# Patient Record
Sex: Female | Born: 1980 | Race: White | Hispanic: No | Marital: Married | Smoking: Current every day smoker
Health system: Southern US, Community
[De-identification: ages and names within clinical notes are randomized; demographics above are authoritative.]

## PROBLEM LIST (undated history)

## (undated) DIAGNOSIS — E079 Disorder of thyroid, unspecified: Secondary | ICD-10-CM

## (undated) DIAGNOSIS — I471 Supraventricular tachycardia, unspecified: Secondary | ICD-10-CM

## (undated) DIAGNOSIS — J45909 Unspecified asthma, uncomplicated: Secondary | ICD-10-CM

## (undated) DIAGNOSIS — I4891 Unspecified atrial fibrillation: Secondary | ICD-10-CM

## (undated) HISTORY — DX: Unspecified atrial fibrillation: I48.91

## (undated) HISTORY — PX: CHOLECYSTECTOMY: SHX55

---

## 2004-11-04 ENCOUNTER — Inpatient Hospital Stay (HOSPITAL_COMMUNITY): Admission: AD | Admit: 2004-11-04 | Discharge: 2004-11-04 | Payer: Self-pay | Admitting: Obstetrics and Gynecology

## 2004-11-10 ENCOUNTER — Inpatient Hospital Stay (HOSPITAL_COMMUNITY): Admission: AD | Admit: 2004-11-10 | Discharge: 2004-11-10 | Payer: Self-pay | Admitting: *Deleted

## 2004-11-11 ENCOUNTER — Inpatient Hospital Stay (HOSPITAL_COMMUNITY): Admission: AD | Admit: 2004-11-11 | Discharge: 2004-11-11 | Payer: Self-pay | Admitting: Obstetrics and Gynecology

## 2008-04-21 ENCOUNTER — Emergency Department (HOSPITAL_COMMUNITY): Admission: EM | Admit: 2008-04-21 | Discharge: 2008-04-22 | Payer: Self-pay | Admitting: Emergency Medicine

## 2008-04-24 ENCOUNTER — Ambulatory Visit (HOSPITAL_COMMUNITY): Admission: RE | Admit: 2008-04-24 | Discharge: 2008-04-24 | Payer: Self-pay | Admitting: General Surgery

## 2008-04-24 ENCOUNTER — Encounter (INDEPENDENT_AMBULATORY_CARE_PROVIDER_SITE_OTHER): Payer: Self-pay | Admitting: General Surgery

## 2008-07-25 ENCOUNTER — Emergency Department (HOSPITAL_COMMUNITY): Admission: EM | Admit: 2008-07-25 | Discharge: 2008-07-25 | Payer: Self-pay | Admitting: Emergency Medicine

## 2011-03-30 NOTE — Op Note (Signed)
NAMEALIE, MOUDY                 ACCOUNT NO.:  [id number]   MEDICAL RECORD NO.:  [id number]          PATIENT TYPE:  AMB   LOCATION:  DAY                          FACILITY:  [facility]   PHYSICIAN:  Anselm Pancoast. Weatherly, M.D.DATE OF BIRTH:  10-Sep-1981   DATE OF PROCEDURE:  04/24/2008  DATE OF DISCHARGE:                               OPERATIVE REPORT   PREOPERATIVE DIAGNOSIS:  Subacute cholecystitis with stones.   OPERATION:  Laparoscopic cholecystectomy with cholangiogram.   General anesthesia.   SURGEON:  Anselm Pancoast. Zachery Dakins, M.D.   ASSISTANT:  Nurse.   HISTORY:  Cecil Bixby is a 30 year old Caucasian female, mother of two,  who presented to the emergency room with pain on Sunday evening.  She  was evaluated, had had multiple previous episodes of indigestion,  usually postprandial, but had not been worked up.  In the emergency room  they did a CBC and CMET which were unremarkable.  They did an ultrasound  and it showed a distended gallbladder with stones, gave her antinausea  medicine and pain medication, and advised that she see [place] in the office  urgently.  I saw her the following day and she said the pain was  somewhat less than it had been but had never completely broken, and she  desired to go ahead and proceed with laparoscopic cholecystectomy as  soon as possible for pain.  It actually was a little less yesterday but  this morning she said she was still having discomfort.  We did not  repeat laboratory studies for preop but since SHE IS ALLERGIC TO  PENICILLIN WHICH CAUSES A RASH she was given 400 mg of Cipro.   She was taken to the operative suite.  Induction of general anesthesia,  endotracheal tube, oral tube into stomach.  The time-out with the  patient identification and planned procedure etc. was completed, and the  Cipro had been administered but this time.  She was prepped with  Betadine solution and draped in a sterile manner.  A small incision was  made below the  umbilicus.  The fascia was identified, picked up between  two Kochers, very carefully opened through the fascia, and underlying  peritoneum was picked up and opened this carefully and went into the  peritoneal cavity.  A pursestring suture of O Vicryl was placed and  Hasson cannula introduced.  The gallbladder had numerous adhesions  around it, it was distended but it was not acutely hemorrhagically  inflamed.  The upper 10-mm trocar was placed after anesthetizing the  fascia in the appropriate position, and the two lateral 5-mm trocars  were placed.  The gallbladder was grasped with a lateral retractor,  picked up, and then the adhesions were kind of carefully taken down from  it.  The proximal portion of the gallbladder peritoneum carefully opened  up, the anterior branch of the cystic artery was identified and doubly  clipped proximally, singly distally, and then divided.  The peritoneum  around the proximal portion of the gallbladder/cystic duct junction was  carefully freed up.  There was a little blood vessel with the  cystic  duct that with opening the cystic duct there was a little area of  bleeding, and this was lightly coagulated.  A Cook catheter was  introduced, held in place with a clip, and then an x-ray obtained.  The  extrahepatic biliary system was normal with good flow into the duodenum,  about a 1.5-cm cystic duct, and both the left-to-right radicals were  later identified.  I then removed the catheter, triply clipped the  cystic duct and then divided it, and then very carefully freed up the  gallbladder from its bed and placed the EndoCatch bag.  There was a  little bile spillage where the grasper was used to grab the gallbladder  and then I thoroughly irrigated it at the completion with almost 2  liters of saline until all return was clear.  Reinspection of the  clipped vessels, cystic duct, was done with good hemostasis, and the  gallbladder was sent for pathology  examination.  I put an additional  figure-of-eight suture in the fascia at the umbilicus and then I  anesthetized the fascia __________ .  The subcutaneous wounds were  closed with 4-0 Monocryl or Vicryl and the benzoin and Steri-Strips were  placed on the skin.  The patient tolerated the procedure nicely.  I  think she is planning on going home after the recovery room and I will  see her in the office in approximately 2 weeks.  She has Percocet left  over from her ER visit and no additional pain medications will be given.           ______________________________  Anselm Pancoast. Zachery Dakins, M.D.     WJW/MEDQ  D:  04/24/2008  T:  04/24/2008  Job:  [id number]

## 2011-04-02 NOTE — Consult Note (Signed)
[name], [name]                 ACCOUNT NO.:  [id number]   MEDICAL RECORD NO.:  [id number]          PATIENT TYPE:  MAT   LOCATION:  MATC                          FACILITY:  [facility]   PHYSICIAN:  Anselm Pancoast. Weatherly, M.D.DATE OF BIRTH:  07-Aug-1981   DATE OF CONSULTATION:  11/10/2004  DATE OF DISCHARGE:                                   CONSULTATION   SURGERY CONSULTATION:   CHIEF COMPLAINT:  Abdominal pain.   HISTORY:  Adrienne [name] is a 30 year old Caucasian female [redacted] weeks pregnant  who presented to the emergency room today with the following history.  Last  week she actually was having diarrhea and was taking Imodium for a few days,  she then started having cramping abdominal pain on Sunday, pain kind of on  the right abdomen going across the upper abdomen to the left, yesterday  maybe somewhat improved, is having bowel movements but not diarrhea anymore  and then today started having increasing pain and presented to the emergency  room I think about midday.  She was seen by Dr. Cherly Hensen who examined her and  did lab studies, she was afebrile, white count was 9800 and a pulse of about  100 and then a CT with oral and IV contrast was performed which showed an 8  mm appendix, no periappendiceal inflammation, a mesenteric node that was 1.4  cm in size kind of in the lower right abdomen and I was called on rule out  appendicitis.  On questioning [name] she said that really the cramping  sensation she describes is more of a cramping than actually bad pain did  start several days after started taking the Imodium and that she has had  problems with both loose and firmer bowel movements during this pregnancy.  This is her second pregnancy.   PHYSICAL EXAMINATION:  She has a gravida abdomen consistent with her  pregnancy size, she has got very active cramping bowel sounds, she is not  true muscle guarding and if you check the CT and where the appendix is  located it is above the iliac  crest and she is certainly cramping in that  location but not really acute abdomen in my opinion.   ASSESSMENT AND PLAN:  On review of the CT with the patient's husband and Dr.  Cherly Hensen I think that the cramping sensation that she is explaining is  probably more constipation that she is trying to push into the transverse  and left colon and I would recommend that we place her on a liquid diet and  then re-examine her tomorrow with a repeat white blood count.  If she has  increase in pain, becomes more tender in this location or whatever she may  require an open appendectomy but I think it is very unlikely to be acute  appendicitis at this time and I think that the Imodium that was taken  approximately a week ago has probably contributed to this constipation that  we see on CT at this time.  The patient I think can be safely released, she  is with her  husband and if she is having increasing pain or becoming tender  in that location then return to the emergency room here, if she is not  having increasing pain this evening on a liquid diet then I would definitely  think that she should return about 9 o'clock tomorrow morning for repeat  white count and Dr. Cherly Hensen or the ER physician can examine her and if there  is any question I will be happy  to re-examine her at that time.  I think it is unlikely this is going to be  appendicitis and hopefully since she is [redacted] weeks pregnant that the need of  surgery is not noted and that certainly fear of preterm delivery if surgery  was indicated or needed at this time and she certainly will not be able to  be done with a laparoscopic examination.      WJW/MEDQ  D:  11/10/2004  T:  11/10/2004  Job:  [id number]   cc:   Maxie Better, M.D.  [address]  [place]  [place] [address]  Fax: [phone]

## 2011-08-12 LAB — DIFFERENTIAL
Basophils Absolute: 0
Basophils Relative: 1
Eosinophils Absolute: 0.4
Eosinophils Relative: 5
Lymphocytes Relative: 36
Lymphs Abs: 2.6
Monocytes Absolute: 0.5
Monocytes Relative: 7
Neutro Abs: 3.9
Neutrophils Relative %: 52

## 2011-08-12 LAB — CBC
HCT: 35.2 — ABNORMAL LOW
Hemoglobin: 12.3
MCHC: 34.9
MCV: 88.7
Platelets: 248
RBC: 3.96
RDW: 12.8
WBC: 7.4

## 2011-08-12 LAB — COMPREHENSIVE METABOLIC PANEL
ALT: 24
AST: 20
Albumin: 3.6
Alkaline Phosphatase: 61
BUN: 14
CO2: 26
Calcium: 8.9
Chloride: 106
Creatinine, Ser: 0.56
GFR calc Af Amer: >60
GFR calc non Af Amer: >60
Glucose, Bld: 102 — ABNORMAL HIGH
Potassium: 3.9
Sodium: 137
Total Bilirubin: 0.7
Total Protein: 6.7

## 2011-08-12 LAB — URINALYSIS, ROUTINE W REFLEX MICROSCOPIC
Bilirubin Urine: NEGATIVE
Glucose, UA: NEGATIVE
Hgb urine dipstick: NEGATIVE
Ketones, ur: NEGATIVE
Nitrite: NEGATIVE
Protein, ur: NEGATIVE
Specific Gravity, Urine: 1.023
Urobilinogen, UA: 1
pH: 7

## 2011-08-12 LAB — URINE MICROSCOPIC-ADD ON

## 2011-08-12 LAB — LIPASE, BLOOD: Lipase: 21

## 2011-08-12 LAB — POCT PREGNANCY, URINE
Operator id: 277751
Preg Test, Ur: NEGATIVE

## 2011-08-18 LAB — POCT CARDIAC MARKERS
CKMB, poc: <1 — ABNORMAL LOW
Myoglobin, poc: 33.8
Troponin i, poc: <0.05

## 2011-08-18 LAB — POCT I-STAT, CHEM 8
BUN: 18
Calcium, Ion: 1.2
Chloride: 108
Creatinine, Ser: 0.9
Glucose, Bld: 101 — ABNORMAL HIGH
HCT: 38
Hemoglobin: 12.9
Potassium: 4.1
Sodium: 141
TCO2: 24

## 2012-06-29 DIAGNOSIS — F419 Anxiety disorder, unspecified: Secondary | ICD-10-CM | POA: Insufficient documentation

## 2017-06-17 DIAGNOSIS — R61 Generalized hyperhidrosis: Secondary | ICD-10-CM | POA: Insufficient documentation

## 2019-05-21 DIAGNOSIS — F17210 Nicotine dependence, cigarettes, uncomplicated: Secondary | ICD-10-CM | POA: Insufficient documentation

## 2020-03-13 ENCOUNTER — Other Ambulatory Visit: Payer: Self-pay

## 2020-03-13 ENCOUNTER — Encounter (HOSPITAL_COMMUNITY): Payer: Self-pay | Admitting: Emergency Medicine

## 2020-03-13 ENCOUNTER — Emergency Department (HOSPITAL_COMMUNITY)
Admission: EM | Admit: 2020-03-13 | Discharge: 2020-03-14 | Disposition: A | Discharge status: Home / Self Care | Payer: BC Managed Care – PPO | Attending: Emergency Medicine | Admitting: Emergency Medicine

## 2020-03-13 ENCOUNTER — Emergency Department (HOSPITAL_COMMUNITY): Payer: BC Managed Care – PPO

## 2020-03-13 DIAGNOSIS — R079 Chest pain, unspecified: Secondary | ICD-10-CM | POA: Insufficient documentation

## 2020-03-13 DIAGNOSIS — Z5321 Procedure and treatment not carried out due to patient leaving prior to being seen by health care provider: Secondary | ICD-10-CM | POA: Diagnosis not present

## 2020-03-13 HISTORY — DX: Supraventricular tachycardia: I47.1

## 2020-03-13 HISTORY — DX: Disorder of thyroid, unspecified: E07.9

## 2020-03-13 HISTORY — DX: Unspecified asthma, uncomplicated: J45.909

## 2020-03-13 HISTORY — DX: Supraventricular tachycardia, unspecified: I47.10

## 2020-03-13 LAB — CBC
HCT: 43.8 % (ref 36.0–46.0)
Hemoglobin: 14.4 g/dL (ref 12.0–15.0)
MCH: 30.2 pg (ref 26.0–34.0)
MCHC: 32.9 g/dL (ref 30.0–36.0)
MCV: 91.8 fL (ref 80.0–100.0)
Platelets: 179 10*3/uL (ref 150–400)
RBC: 4.77 MIL/uL (ref 3.87–5.11)
RDW: 12.1 % (ref 11.5–15.5)
WBC: 6.3 10*3/uL (ref 4.0–10.5)
nRBC: 0 % (ref 0.0–0.2)

## 2020-03-13 LAB — BASIC METABOLIC PANEL
Anion gap: 11 (ref 5–15)
BUN: 14 mg/dL (ref 6–20)
CO2: 22 mmol/L (ref 22–32)
Calcium: 9.3 mg/dL (ref 8.9–10.3)
Chloride: 106 mmol/L (ref 98–111)
Creatinine, Ser: 0.6 mg/dL (ref 0.44–1.00)
GFR calc Af Amer: >60 mL/min (ref >60)
GFR calc non Af Amer: >60 mL/min (ref >60)
Glucose, Bld: 98 mg/dL (ref 70–99)
Potassium: 4.2 mmol/L (ref 3.5–5.1)
Sodium: 139 mmol/L (ref 135–145)

## 2020-03-13 LAB — I-STAT BETA HCG BLOOD, ED (MC, WL, AP ONLY): I-stat hCG, quantitative: <5 m[IU]/mL (ref <5)

## 2020-03-13 LAB — TROPONIN I (HIGH SENSITIVITY): Troponin I (High Sensitivity): <2 ng/L (ref <18)

## 2020-03-13 LAB — PROTIME-INR
INR: 1 (ref 0.8–1.2)
Prothrombin Time: 12.4 seconds (ref 11.4–15.2)

## 2020-03-13 MED ORDER — SODIUM CHLORIDE 0.9% FLUSH: 3.0000 mL | Freq: Once | INTRAVENOUS | Status: DC

## 2020-03-13 NOTE — ED Triage Notes (Signed)
Patient arrived with EMS from home reports central chest pressure with palpitations and mild dizziness , no SOB , denies emesis or diaphoresis , pressure radiating to left arm , no cough or fever .

## 2020-03-13 NOTE — ED Notes (Signed)
pts name was called 3x for updated VS with no response.

## 2020-03-14 LAB — TROPONIN I (HIGH SENSITIVITY): Troponin I (High Sensitivity): <2 ng/L (ref <18)

## 2020-03-14 NOTE — ED Notes (Signed)
Pt did not respond when called for vitals check 

## 2020-05-14 DIAGNOSIS — E032 Hypothyroidism due to medicaments and other exogenous substances: Secondary | ICD-10-CM | POA: Insufficient documentation

## 2020-05-14 DIAGNOSIS — R002 Palpitations: Secondary | ICD-10-CM | POA: Insufficient documentation

## 2020-09-15 HISTORY — PX: ABDOMINOPLASTY: SUR9

## 2021-04-15 HISTORY — PX: LIPOSUCTION: SHX10

## 2021-06-12 ENCOUNTER — Emergency Department (HOSPITAL_COMMUNITY)
Admission: EM | Admit: 2021-06-12 | Discharge: 2021-06-12 | Disposition: A | Discharge status: Home / Self Care | Payer: BC Managed Care – PPO | Attending: Emergency Medicine | Admitting: Emergency Medicine

## 2021-06-12 ENCOUNTER — Emergency Department (HOSPITAL_COMMUNITY): Payer: BC Managed Care – PPO

## 2021-06-12 ENCOUNTER — Other Ambulatory Visit: Payer: Self-pay

## 2021-06-12 DIAGNOSIS — R519 Headache, unspecified: Secondary | ICD-10-CM | POA: Insufficient documentation

## 2021-06-12 DIAGNOSIS — R000 Tachycardia, unspecified: Secondary | ICD-10-CM | POA: Insufficient documentation

## 2021-06-12 DIAGNOSIS — R202 Paresthesia of skin: Secondary | ICD-10-CM | POA: Insufficient documentation

## 2021-06-12 DIAGNOSIS — J45909 Unspecified asthma, uncomplicated: Secondary | ICD-10-CM | POA: Diagnosis not present

## 2021-06-12 DIAGNOSIS — Y900 Blood alcohol level of less than 20 mg/100 ml: Secondary | ICD-10-CM | POA: Diagnosis not present

## 2021-06-12 DIAGNOSIS — H538 Other visual disturbances: Secondary | ICD-10-CM | POA: Insufficient documentation

## 2021-06-12 DIAGNOSIS — F17200 Nicotine dependence, unspecified, uncomplicated: Secondary | ICD-10-CM | POA: Insufficient documentation

## 2021-06-12 DIAGNOSIS — R42 Dizziness and giddiness: Secondary | ICD-10-CM | POA: Insufficient documentation

## 2021-06-12 DIAGNOSIS — Z79899 Other long term (current) drug therapy: Secondary | ICD-10-CM | POA: Insufficient documentation

## 2021-06-12 DIAGNOSIS — E039 Hypothyroidism, unspecified: Secondary | ICD-10-CM | POA: Insufficient documentation

## 2021-06-12 LAB — DIFFERENTIAL
Abs Immature Granulocytes: 0.03 10*3/uL (ref 0.00–0.07)
Basophils Absolute: 0 10*3/uL (ref 0.0–0.1)
Basophils Relative: 0 %
Eosinophils Absolute: 0.3 10*3/uL (ref 0.0–0.5)
Eosinophils Relative: 3 %
Immature Granulocytes: 0 %
Lymphocytes Relative: 23 %
Lymphs Abs: 2.3 10*3/uL (ref 0.7–4.0)
Monocytes Absolute: 0.7 10*3/uL (ref 0.1–1.0)
Monocytes Relative: 7 %
Neutro Abs: 6.6 10*3/uL (ref 1.7–7.7)
Neutrophils Relative %: 67 %

## 2021-06-12 LAB — URINALYSIS, ROUTINE W REFLEX MICROSCOPIC
Bilirubin Urine: NEGATIVE
Glucose, UA: NEGATIVE mg/dL
Hgb urine dipstick: NEGATIVE
Ketones, ur: 5 mg/dL — AB
Leukocytes,Ua: NEGATIVE
Nitrite: NEGATIVE
Protein, ur: NEGATIVE mg/dL
Specific Gravity, Urine: >1.046 — ABNORMAL HIGH (ref 1.005–1.030)
pH: 6 (ref 5.0–8.0)

## 2021-06-12 LAB — I-STAT CHEM 8, ED
BUN: 15 mg/dL (ref 6–20)
Calcium, Ion: 1.18 mmol/L (ref 1.15–1.40)
Chloride: 108 mmol/L (ref 98–111)
Creatinine, Ser: 0.5 mg/dL (ref 0.44–1.00)
Glucose, Bld: 113 mg/dL — ABNORMAL HIGH (ref 70–99)
HCT: 44 % (ref 36.0–46.0)
Hemoglobin: 15 g/dL (ref 12.0–15.0)
Potassium: 3.6 mmol/L (ref 3.5–5.1)
Sodium: 140 mmol/L (ref 135–145)
TCO2: 22 mmol/L (ref 22–32)

## 2021-06-12 LAB — I-STAT BETA HCG BLOOD, ED (MC, WL, AP ONLY): I-stat hCG, quantitative: <5 m[IU]/mL (ref <5)

## 2021-06-12 LAB — COMPREHENSIVE METABOLIC PANEL
ALT: 14 U/L (ref 0–44)
AST: 16 U/L (ref 15–41)
Albumin: 4 g/dL (ref 3.5–5.0)
Alkaline Phosphatase: 61 U/L (ref 38–126)
Anion gap: 7 (ref 5–15)
BUN: 13 mg/dL (ref 6–20)
CO2: 22 mmol/L (ref 22–32)
Calcium: 9.4 mg/dL (ref 8.9–10.3)
Chloride: 109 mmol/L (ref 98–111)
Creatinine, Ser: 0.65 mg/dL (ref 0.44–1.00)
GFR, Estimated: >60 mL/min (ref >60)
Glucose, Bld: 115 mg/dL — ABNORMAL HIGH (ref 70–99)
Potassium: 3.6 mmol/L (ref 3.5–5.1)
Sodium: 138 mmol/L (ref 135–145)
Total Bilirubin: 0.5 mg/dL (ref 0.3–1.2)
Total Protein: 7 g/dL (ref 6.5–8.1)

## 2021-06-12 LAB — RAPID URINE DRUG SCREEN, HOSP PERFORMED
Amphetamines: NOT DETECTED
Barbiturates: NOT DETECTED
Benzodiazepines: NOT DETECTED
Cocaine: NOT DETECTED
Opiates: NOT DETECTED
Tetrahydrocannabinol: NOT DETECTED

## 2021-06-12 LAB — CBC
HCT: 44.1 % (ref 36.0–46.0)
Hemoglobin: 14.5 g/dL (ref 12.0–15.0)
MCH: 31.5 pg (ref 26.0–34.0)
MCHC: 32.9 g/dL (ref 30.0–36.0)
MCV: 95.9 fL (ref 80.0–100.0)
Platelets: 225 10*3/uL (ref 150–400)
RBC: 4.6 MIL/uL (ref 3.87–5.11)
RDW: 12.9 % (ref 11.5–15.5)
WBC: 10 10*3/uL (ref 4.0–10.5)
nRBC: 0 % (ref 0.0–0.2)

## 2021-06-12 LAB — ETHANOL: Alcohol, Ethyl (B): <10 mg/dL (ref <10)

## 2021-06-12 LAB — APTT: aPTT: 29 seconds (ref 24–36)

## 2021-06-12 LAB — PROTIME-INR
INR: 0.9 (ref 0.8–1.2)
Prothrombin Time: 12.4 seconds (ref 11.4–15.2)

## 2021-06-12 MED ORDER — IOHEXOL 350 MG/ML SOLN
75.0000 mL | Freq: Once | INTRAVENOUS | Status: AC | PRN
  Administered 2021-06-12: 75 mL via INTRAVENOUS

## 2021-06-12 NOTE — ED Notes (Signed)
Noted L arm droop, decreased sensation to L arm. No other neuro deficits noted in triage

## 2021-06-12 NOTE — ED Triage Notes (Signed)
Pt c/o "spells" of disorientation, dizziness, tingling from behind L ear to mid-arm, feeling that is constant." Denies [place], endorses [facility], feels vision is constantly blurry, worse during episodes. States it began last night, felt "snapping" in her neck that woke her up. Denies medical hx or injury

## 2021-06-12 NOTE — ED Notes (Signed)
Pt ambulated to the bathroom to provide UA specimen

## 2021-06-12 NOTE — ED Provider Notes (Signed)
[name] Provider Note   CSN: [id number] Arrival date & time: 06/12/21  1503     History Chief Complaint  Patient presents with   Dizziness   Headache    Ad[name] is a 40 y.o. female.  Patient is a 40 year old female with a prior history of hypothyroidism and SVT who is presenting today with complaints of a sensation of snapping behind her left ear that started around midnight last night.  Patient was just lying in bed when the symptoms occurred.  It is seconds at a time and just feels like a rubber band popping.  She has not noticed any neck pain or significant headache.  She has felt nervous and slightly blurry vision today.  She denies any numbness or weakness in her arms or legs but does report dizziness when she moves her head too quickly or when she is trying to walk she feels a little bit off.  This snapping sensation has occurred to 4-5 times now since midnight.  It does not seem to be provoked by anything.  She denies any recent injury or activity that she could have done that would have caused the symptoms.  She has not noticed any change in her hearing.  No pain behind the ear or pain with palpation of her neck.  No similar symptoms in the past.  No pain in the neck or back.   Dizziness Associated symptoms: headaches   Headache Associated symptoms: dizziness       Past Medical History:  Diagnosis Date   Asthma    SVT (supraventricular tachycardia) (HCC)    Thyroid disease     There are no problems to display for this patient.   Past Surgical History:  Procedure Laterality Date   CESAREAN SECTION     CHOLECYSTECTOMY       OB History   No obstetric history on file.     No family history on file.  Social History   Tobacco Use   Smoking status: Every Day   Smokeless tobacco: Never  Substance Use Topics   Alcohol use: Never   Drug use: Never    Home Medications Prior to Admission medications   Not on  File    Allergies    Morphine and related and Penicillins  Review of Systems   Review of Systems  Neurological:  Positive for dizziness and headaches.  All other systems reviewed and are negative.  Physical Exam Updated Vital Signs BP 125/84 (BP Location: Left Arm)   Pulse 99   Temp 98 F (36.7 C) (Oral)   Resp (!) 25   SpO2 98%   Physical Exam Vitals and nursing note reviewed.  Constitutional:      General: She is not in acute distress.    Appearance: She is well-developed.  HENT:     Head: Normocephalic and atraumatic.     Right Ear: Tympanic membrane and external ear normal.     Left Ear: Tympanic membrane and external ear normal.     Ears:     Comments: No mastoid tenderness bilaterally    Nose: Nose normal.  Eyes:     General: No visual field deficit.    Pupils: Pupils are equal, round, and reactive to light.  Cardiovascular:     Rate and Rhythm: Normal rate and regular rhythm.     Heart sounds: Normal heart sounds. No murmur heard.   No friction rub.  Pulmonary:     Effort:  Pulmonary effort is normal.     Breath sounds: Normal breath sounds. No wheezing or rales.  Abdominal:     General: Bowel sounds are normal. There is no distension.     Palpations: Abdomen is soft.     Tenderness: There is no abdominal tenderness. There is no guarding or rebound.  Musculoskeletal:        General: No tenderness. Normal range of motion.     Cervical back: Normal range of motion and neck supple. No tenderness. No spinous process tenderness or muscular tenderness.     Comments: No edema  Lymphadenopathy:     Cervical: No cervical adenopathy.  Skin:    General: Skin is warm and dry.     Findings: No rash.  Neurological:     Mental Status: She is alert and oriented to person, place, and time.     Cranial Nerves: No cranial nerve deficit or facial asymmetry.     Sensory: Sensation is intact.     Motor: Motor function is intact. No weakness or pronator drift.      Coordination: Coordination is intact. Coordination normal. Finger-Nose-Finger Test and Heel to [facility] Test normal.     Gait: Gait is intact. Gait normal.     Comments: HINTS exam is neg.    Psychiatric:        Speech: Speech normal.        Behavior: Behavior normal.    ED Results / Procedures / Treatments   Labs (all labs ordered are listed, but only abnormal results are displayed) Labs Reviewed  COMPREHENSIVE METABOLIC PANEL - Abnormal; Notable for the following components:      Result Value   Glucose, Bld 115 (*)    All other components within normal limits  I-STAT CHEM 8, ED - Abnormal; Notable for the following components:   Glucose, Bld 113 (*)    All other components within normal limits  ETHANOL  PROTIME-INR  APTT  CBC  DIFFERENTIAL  RAPID URINE DRUG SCREEN, HOSP PERFORMED  URINALYSIS, ROUTINE W REFLEX MICROSCOPIC  I-STAT BETA HCG BLOOD, ED ([facility], [facility], [facility] ONLY)    EKG EKG Interpretation  Date/Time:  Friday June 12 2021 15:28:58 EDT Ventricular Rate:  123 PR Interval:  134 QRS Duration: 106 QT Interval:  320 QTC Calculation: 458 R Axis:   95 Text Interpretation: Sinus tachycardia Right atrial enlargement Rightward axis Pulmonary disease pattern Nonspecific ST abnormality Confirmed by Gwyneth Sprout ([id number]) on 06/12/2021 5:58:04 PM  Radiology CT Angio Head W or Wo Contrast  Result Date: 06/12/2021 CLINICAL DATA:  Carotid artery dissection suspected. Additional history provided: Spells of disorientation, tingling from behind left ear to mid arm, dizziness, EXAM: CT ANGIOGRAPHY HEAD AND NECK TECHNIQUE: Multidetector CT imaging of the head and neck was performed using the standard protocol during bolus administration of intravenous contrast. Multiplanar CT image reconstructions and MIPs were obtained to evaluate the vascular anatomy. Carotid stenosis measurements (when applicable) are obtained utilizing NASCET criteria, using the distal internal carotid diameter as the  denominator. CONTRAST:  37mL OMNIPAQUE IOHEXOL 350 MG/ML SOLN COMPARISON:  None. FINDINGS: CT HEAD FINDINGS Brain: Cerebral volume is normal. There is no acute intracranial hemorrhage. No demarcated cortical infarct. No extra-axial fluid collection. No evidence of an intracranial mass. No midline shift. Vascular: No hyperdense vessel. Skull: Normal. Negative for fracture or focal lesion. Sinuses: Moderate mucosal thickening within the right sphenoid sinus. Mild mucosal thickening within the left sphenoid sinus. Complete opacification of the right sphenoid sinus. Mild mucosal  thickening within the left sphenoid sinus. Mucosal thickening and fluid levels within the bilateral maxillary sinuses. Orbits: No mass or acute finding. Review of the MIP images confirms the above findings CTA NECK FINDINGS Aortic arch: Standard aortic branching. The visualized aortic arch is normal in caliber. Streak and beam hardening artifact arising from a dense right-sided contrast bolus partially obscures the proximal right subclavian artery. Within this limitation, there is no hemodynamically significant stenosis of the innominate or proximal subclavian arteries. Right carotid system: CCA and ICA patent within the neck without stenosis or significant atherosclerotic disease. No evidence of dissection. Left carotid system: CCA and ICA patent within the neck without stenosis or significant atherosclerotic disease. No evidence of dissection. Vertebral arteries: Codominant and patent within the neck without stenosis. No evidence of dissection. Skeleton: No neck mass or cervical lymphadenopathy. Other neck: Cervical spondylosis. Upper chest: No consolidation within the imaged lung apices. Review of the MIP images confirms the above findings CTA HEAD FINDINGS Anterior circulation: The intracranial internal carotid arteries are patent. The M1 middle cerebral arteries are patent. No M2 proximal branch occlusion is identified. Atherosclerotic  irregularity of the M2 and more distal middle cerebral artery vessels bilaterally. Most notably, there is a severe focal stenosis within a mid M2 right MCA vessel (series 14, image 12). The anterior cerebral arteries are patent. No intracranial aneurysm is identified. Posterior circulation: The posterior cerebral arteries are patent. Mild atherosclerotic irregularity of the V4 vertebral arteries bilaterally without stenosis The basilar artery is patent. The posterior cerebral arteries are patent. Posterior communicating arteries are hypoplastic or absent bilaterally. Venous sinuses: Within the limitations of contrast timing, no convincing thrombus. Anatomic variants: As described Review of the MIP images confirms the above findings IMPRESSION: CT head: 1. No evidence of acute intracranial abnormality. 2. Paranasal sinus disease, as described. Correlate for acute sinusitis. CTA neck: 1. The common carotid, internal carotid and vertebral arteries are patent within the neck without stenosis. No evidence of dissection. 2. Cervical spondylosis. CTA head: 1. No intracranial large vessel occlusion is identified. 2. Intracranial atherosclerotic disease, as described. Most notably, there is a severe focal stenosis within a mid M2 right MCA vessel. Electronically Signed   By: Jackey Loge DO   On: 06/12/2021 18:18   CT Angio Neck W and/or Wo Contrast  Result Date: 06/12/2021 CLINICAL DATA:  Carotid artery dissection suspected. Additional history provided: Spells of disorientation, tingling from behind left ear to mid arm, dizziness, EXAM: CT ANGIOGRAPHY HEAD AND NECK TECHNIQUE: Multidetector CT imaging of the head and neck was performed using the standard protocol during bolus administration of intravenous contrast. Multiplanar CT image reconstructions and MIPs were obtained to evaluate the vascular anatomy. Carotid stenosis measurements (when applicable) are obtained utilizing NASCET criteria, using the distal internal  carotid diameter as the denominator. CONTRAST:  56mL OMNIPAQUE IOHEXOL 350 MG/ML SOLN COMPARISON:  None. FINDINGS: CT HEAD FINDINGS Brain: Cerebral volume is normal. There is no acute intracranial hemorrhage. No demarcated cortical infarct. No extra-axial fluid collection. No evidence of an intracranial mass. No midline shift. Vascular: No hyperdense vessel. Skull: Normal. Negative for fracture or focal lesion. Sinuses: Moderate mucosal thickening within the right sphenoid sinus. Mild mucosal thickening within the left sphenoid sinus. Complete opacification of the right sphenoid sinus. Mild mucosal thickening within the left sphenoid sinus. Mucosal thickening and fluid levels within the bilateral maxillary sinuses. Orbits: No mass or acute finding. Review of the MIP images confirms the above findings CTA NECK FINDINGS Aortic arch: Standard  aortic branching. The visualized aortic arch is normal in caliber. Streak and beam hardening artifact arising from a dense right-sided contrast bolus partially obscures the proximal right subclavian artery. Within this limitation, there is no hemodynamically significant stenosis of the innominate or proximal subclavian arteries. Right carotid system: CCA and ICA patent within the neck without stenosis or significant atherosclerotic disease. No evidence of dissection. Left carotid system: CCA and ICA patent within the neck without stenosis or significant atherosclerotic disease. No evidence of dissection. Vertebral arteries: Codominant and patent within the neck without stenosis. No evidence of dissection. Skeleton: No neck mass or cervical lymphadenopathy. Other neck: Cervical spondylosis. Upper chest: No consolidation within the imaged lung apices. Review of the MIP images confirms the above findings CTA HEAD FINDINGS Anterior circulation: The intracranial internal carotid arteries are patent. The M1 middle cerebral arteries are patent. No M2 proximal branch occlusion is  identified. Atherosclerotic irregularity of the M2 and more distal middle cerebral artery vessels bilaterally. Most notably, there is a severe focal stenosis within a mid M2 right MCA vessel (series 14, image 12). The anterior cerebral arteries are patent. No intracranial aneurysm is identified. Posterior circulation: The posterior cerebral arteries are patent. Mild atherosclerotic irregularity of the V4 vertebral arteries bilaterally without stenosis The basilar artery is patent. The posterior cerebral arteries are patent. Posterior communicating arteries are hypoplastic or absent bilaterally. Venous sinuses: Within the limitations of contrast timing, no convincing thrombus. Anatomic variants: As described Review of the MIP images confirms the above findings IMPRESSION: CT head: 1. No evidence of acute intracranial abnormality. 2. Paranasal sinus disease, as described. Correlate for acute sinusitis. CTA neck: 1. The common carotid, internal carotid and vertebral arteries are patent within the neck without stenosis. No evidence of dissection. 2. Cervical spondylosis. CTA head: 1. No intracranial large vessel occlusion is identified. 2. Intracranial atherosclerotic disease, as described. Most notably, there is a severe focal stenosis within a mid M2 right MCA vessel. Electronically Signed   By: Jackey LogeKyle  Golden DO   On: 06/12/2021 18:18    Procedures Procedures   Medications Ordered in ED Medications  iohexol (OMNIPAQUE) 350 MG/ML injection 75 mL (75 mLs Intravenous Contrast Given 06/12/21 1721)    ED Course  I have reviewed the triage vital signs and the nursing notes.  Pertinent labs & imaging results that were available during my care of the patient were reviewed by me and considered in my medical decision making (see chart for details).    MDM Rules/Calculators/A&P                           40 year old healthy female presenting today with unusual sensation of a snapping pain behind her left ear,  feeling dizzy that has been occurring intermittently since midnight.  In triage they noted the patient had a left arm drift and decreased sensation in the left arm however on my exam patient has no evidence of pronator drift and sensation and strength are intact.  Patient has no acute findings of her ears and normal visual fields.  She has no nystagmus noted at this time.  She denies any recent injuries, chiropractics or hyperextension of the neck.  She does work a Health and safety inspectordesk job but denies any neck pain or headache.  Symptoms are not classic for stroke.  Concern for possible dissection versus aneurysm.  Labs are all normal and reassuring.  On exam patient currently just reports she feels slightly dizzy but has no  other complaints.  HINTS exam is normal.  7:36 PM CT overall noncannulated approximately a small area of severe stenosis in M2.  Spoke with Dr. Otelia Limes with neurology about management of this.  He reports this could be artifact but a baby aspirin would be all that is needed at this time.  Symptoms patient is having today would not be related to this.  Given her negative scan otherwise, normal exam and normal hints exam do not feel that patient needs an MRI at this time and is most likely peripheral in nature.  Will give follow-up with ENT.  Findings discussed with the patient and her husband.  They are comfortable with this plan.  She already has meclizine at home.   Final Clinical Impression(s) / ED Diagnoses Final diagnoses:  Vertigo    Rx / DC Orders ED Discharge Orders     None        Gwyneth Sprout, MD 06/12/21 1939

## 2021-06-12 NOTE — Discharge Instructions (Addendum)
Your CAT scans today look normal except for possible narrowing in 1 small area which may even be artifact but to be safe you can take a baby aspirin per day.  No signs of aneurysms, stroke or bleeding today.  No signs of brain tumors.  You can take meclizine as needed for the dizziness.  However if it becomes worsening or unable to walk or start vomiting, high fever or severe neck pain you should return to the emergency room.

## 2021-06-12 NOTE — ED Provider Notes (Signed)
Emergency Medicine Provider Triage Evaluation Note  Adrienne [name] , a 40 y.o. female  was evaluated in triage.  Pt complains of let neck pain, headache, left arm tingling and bilat blurred vision that started at 12am. Denies weakness.  Review of Systems  Positive: Neck pain, headache, left arm tingling, blurred vision Negative: weakness  Physical Exam  BP (!) 136/93 (BP Location: Left Arm)   Pulse (!) 115   Temp 98.8 F (37.1 C)   Resp 18   SpO2 96%  Gen:   Awake, no distress   Resp:  Normal effort  MSK:   Moves extremities without difficulty  Other:  Left arm drift, decreased sensation to the left arm, no facial droop, no visual field cut  Medical Decision Making  Medically screening exam initiated at 3:34 PM.  Appropriate orders placed.  Adrienne [name] was informed that the remainder of the evaluation will be completed by another provider, this initial triage assessment does not replace that evaluation, and the importance of remaining in the ED until their evaluation is complete.     Karrie Meres, PA-C 06/12/21 1548    Cathren Laine, MD 06/13/21 1256

## 2021-11-10 ENCOUNTER — Other Ambulatory Visit: Payer: Self-pay | Admitting: Neurosurgery

## 2021-11-10 DIAGNOSIS — M5416 Radiculopathy, lumbar region: Secondary | ICD-10-CM

## 2021-12-08 ENCOUNTER — Inpatient Hospital Stay: Admission: RE | Admit: 2021-12-08 | Payer: BC Managed Care – PPO | Source: Ambulatory Visit

## 2022-06-08 ENCOUNTER — Other Ambulatory Visit (HOSPITAL_COMMUNITY)
Admission: RE | Admit: 2022-06-08 | Discharge: 2022-06-08 | Disposition: A | Discharge status: Home / Self Care | Payer: BC Managed Care – PPO | Source: Ambulatory Visit | Attending: Advanced Practice Midwife | Admitting: Advanced Practice Midwife

## 2022-06-08 ENCOUNTER — Encounter (HOSPITAL_BASED_OUTPATIENT_CLINIC_OR_DEPARTMENT_OTHER): Payer: Self-pay | Admitting: Advanced Practice Midwife

## 2022-06-08 ENCOUNTER — Telehealth (HOSPITAL_BASED_OUTPATIENT_CLINIC_OR_DEPARTMENT_OTHER): Payer: Self-pay | Admitting: Advanced Practice Midwife

## 2022-06-08 ENCOUNTER — Ambulatory Visit (INDEPENDENT_AMBULATORY_CARE_PROVIDER_SITE_OTHER): Payer: BC Managed Care – PPO | Admitting: Advanced Practice Midwife

## 2022-06-08 VITALS — BP 128/89 | HR 105 | Ht 63.75 in | Wt 203.2 lb

## 2022-06-08 DIAGNOSIS — Z124 Encounter for screening for malignant neoplasm of cervix: Secondary | ICD-10-CM

## 2022-06-08 DIAGNOSIS — J302 Other seasonal allergic rhinitis: Secondary | ICD-10-CM | POA: Insufficient documentation

## 2022-06-08 DIAGNOSIS — Z23 Encounter for immunization: Secondary | ICD-10-CM

## 2022-06-08 DIAGNOSIS — Z1231 Encounter for screening mammogram for malignant neoplasm of breast: Secondary | ICD-10-CM | POA: Diagnosis not present

## 2022-06-08 DIAGNOSIS — N939 Abnormal uterine and vaginal bleeding, unspecified: Secondary | ICD-10-CM | POA: Diagnosis not present

## 2022-06-08 MED ORDER — SLYND 4 MG PO TABS: 1.0000 | ORAL_TABLET | Freq: Every day | ORAL | 11 refills | Status: DC

## 2022-06-08 NOTE — Telephone Encounter (Addendum)
Rx sent today for [facility] POP for AUB.  This may not be on formulary for pt insurance. Given irregular bleeding with other POPs, this medication will more likely create a withdrawal bleed monthly, and reduce her breakthrough bleeding.  I called patient after her visit to see if costs/insurance were prohibitive at the pharmacy.  Our office can do prior authorization, or pt can use discount app at pharmacy to lower cost. Pt has 2 risk factors for OCPs with smoking and age over 29 so progesterone only methods preferred.

## 2022-06-08 NOTE — Progress Notes (Signed)
Subjective:     Adrienne [name] is a 41 y.o. female here at [facility] for a routine exam.  Current complaints: bleeding has become more irregular, heavy and more painful with recent menses.  Personal health questionnaire reviewed: yes.  Do you have a primary care provider? yes Do you feel safe at home? yes  Flowsheet Row Office Visit from 06/08/2022 in [facility]  PHQ-2 Total Score 0       Health Maintenance Due  Topic Date Due   HIV Screening  Never done   Hepatitis C Screening  Never done   PAP SMEAR-Modifier  Never done   COVID-19 Vaccine (4 - Booster for Pfizer series) 12/06/2020   HPV VACCINES (2 - 3-dose SCDM series) 07/06/2022     Risk factors for chronic health problems: Smoking: Cigarettes daily Alchohol/how much: occasional Pt BMI: Body mass index is 35.15 kg/m.   Gynecologic History Patient's last menstrual period was 05/30/2022 (exact date). Contraception: oral progesterone-only contraceptive Last Pap: 2018. Results were: normal Last mammogram: 08/19/21.  Results were: normal.  Obstetric History OB History  Gravida Para Term Preterm AB Living  4 2     2 2   SAB IAB Ectopic Multiple Live Births    2          # Outcome Date GA Lbr Len/2nd Weight Sex Delivery Anes PTL Lv  4 Para           3 Para           2 IAB           1 IAB              The following portions of the patient's history were reviewed and updated as appropriate: allergies, current medications, past family history, past medical history, past social history, past surgical history, and problem list.  Review of Systems Pertinent items noted in HPI and remainder of comprehensive ROS otherwise negative.    Objective:   BP 128/89   Pulse (!) 105   Ht 5' 3.75" (1.619 m)   Wt 203 lb 3.2 oz (92.2 kg)   LMP 05/30/2022 (Exact Date)   BMI 35.15 kg/m  VS reviewed, nursing note reviewed,  Constitutional: well developed, well nourished, no distress HEENT:  normocephalic CV: normal rate Pulm/chest wall: normal effort Breast Exam:   exam performed: right breast normal without mass, skin or nipple changes or axillary nodes, left breast normal without mass, skin or nipple changes or axillary nodes Abdomen: soft Neuro: alert and oriented x 3 Skin: warm, dry Psych: affect normal Pelvic exam: Performed: Cervix pink, visually closed, without lesion, scant white creamy discharge, vaginal walls and external genitalia normal Bimanual exam: Cervix 0/long/high, firm, anterior, neg CMT, uterus nontender, nonenlarged, adnexa without tenderness, enlargement, or mass       Assessment/Plan:   1. Screening mammogram for breast cancer  - MM DIGITAL SCREENING BILATERAL; Future  2. Screening for cervical cancer  - Cytology - PAP( [facility])  3. Need for prophylactic vaccination against human papillomavirus (HPV) types 6, 11, 16, and 18 --Pt desires HPV vaccine series - HPV 9-valent vaccine,Recombinat  4. Abnormal uterine bleeding (AUB) --Pt was taking Heather, POP daily, stopped 6 months ago with irregular bleeding, restarted last month, still having bleeding Q 2 weeks, heavy with clots --FU in 3 months given change in POPs  - Drospirenone (SLYND) 4 MG TABS; Take 1 tablet by mouth daily at 6 (six) AM.  Dispense: 28 tablet;  Refill: 11       Return in about 3 months (around 09/08/2022) for Gyn follow up for Abnormal Uterine Bleeding.   Sharen Counter, CNM 6:19 PM

## 2022-06-09 LAB — CYTOLOGY - PAP
Comment: NEGATIVE
Diagnosis: NEGATIVE
High risk HPV: NEGATIVE

## 2022-06-18 IMAGING — CT CT ANGIO NECK
1 of 11 series · 12 of 46 positions shown, 17 images · IV contrast (OMNI)
Comparison: None.

CLINICAL DATA: Carotid artery dissection suspected. Additional
history provided: Spells of disorientation, tingling from behind
left ear to mid arm, dizziness,

EXAM:
CT ANGIOGRAPHY HEAD AND NECK
TECHNIQUE: Multidetector CT imaging of the head and neck was performed using
the standard protocol during bolus administration of intravenous
contrast. Multiplanar CT image reconstructions and MIPs were
obtained to evaluate the vascular anatomy. Carotid stenosis
measurements (when applicable) are obtained utilizing NASCET
criteria, using the distal internal carotid diameter as the
denominator.
CONTRAST:  75mL OMNIPAQUE IOHEXOL 350 MG/ML SOLN

[Series 16: thin · axial · 0.48mm/px · z∈[-313,+8]mm · 12 of 734 slices shown, 17 images]
[im 46/734  soft-tissue]
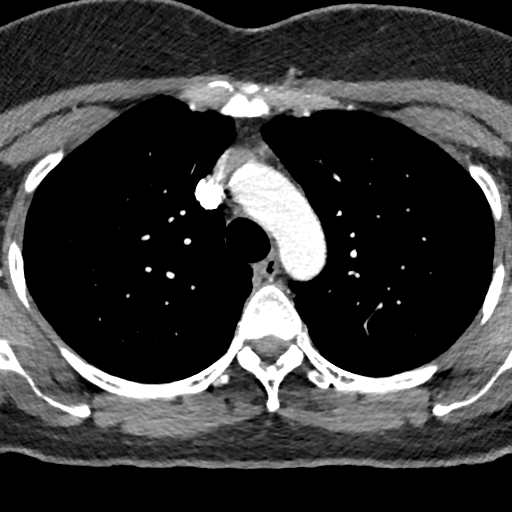
[im 46/734  bone]
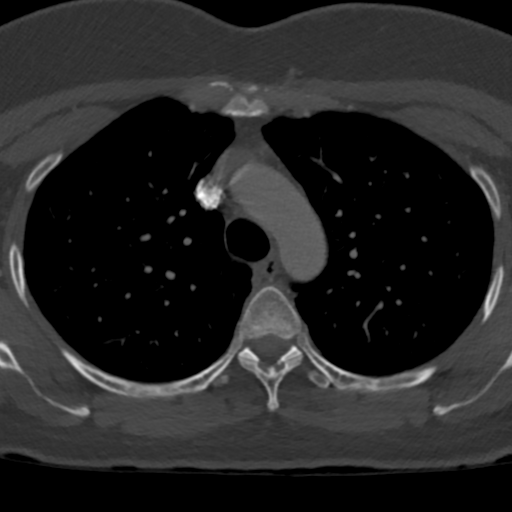
[im 138/734  soft-tissue]
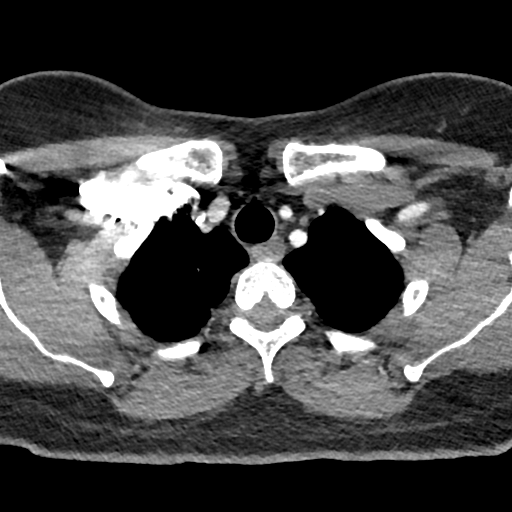
[im 184/734  soft-tissue]
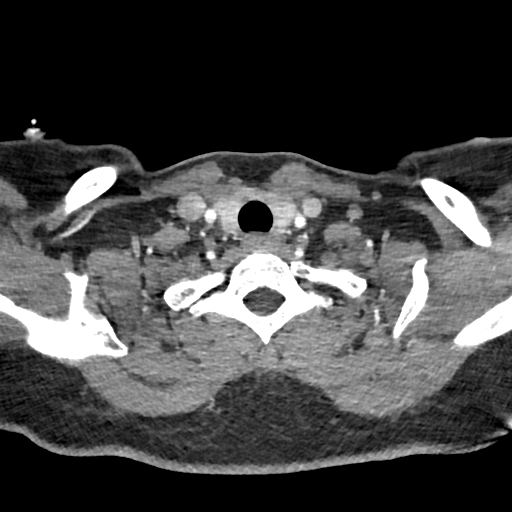
[im 230/734  soft-tissue]
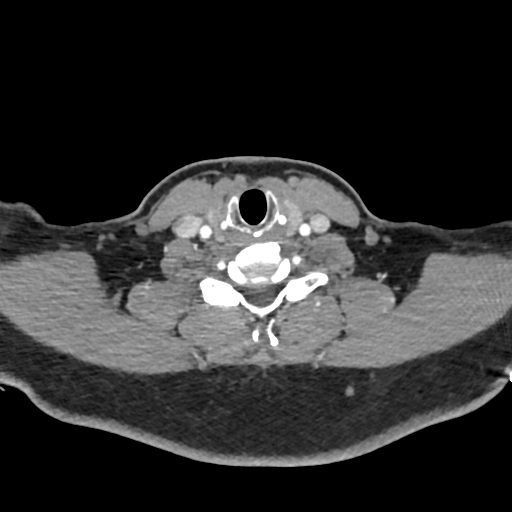
[im 321/734  soft-tissue]
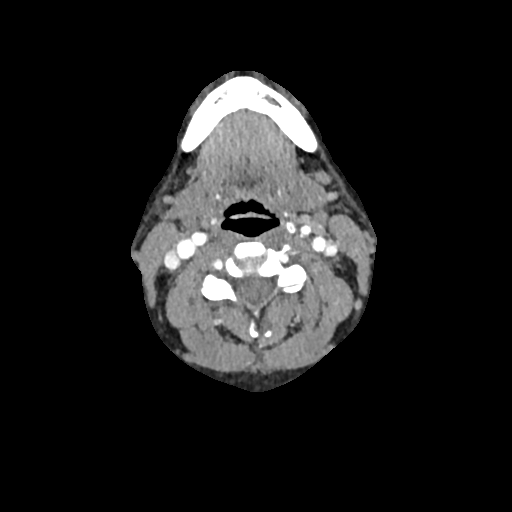
[im 367/734  soft-tissue]
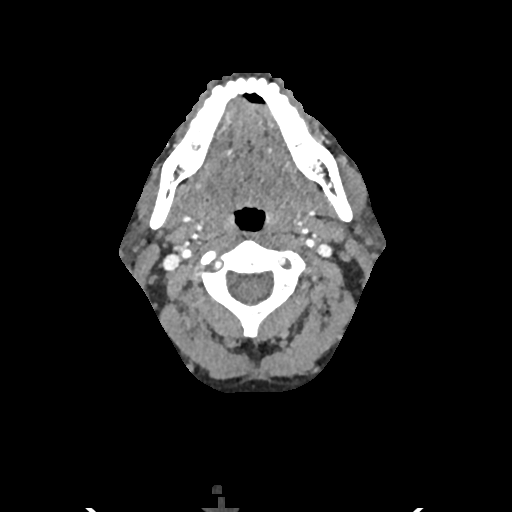
[im 413/734  soft-tissue]
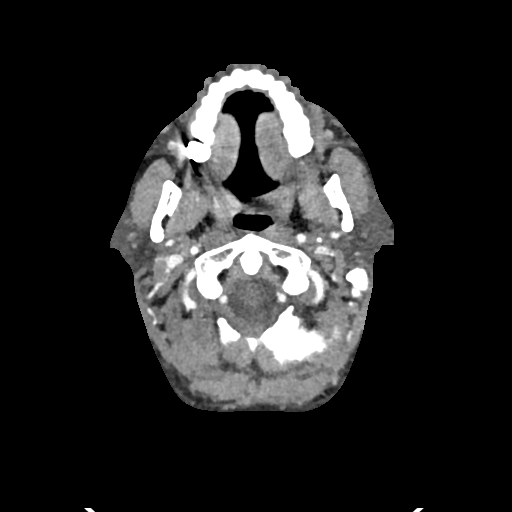
[im 504/734  soft-tissue]
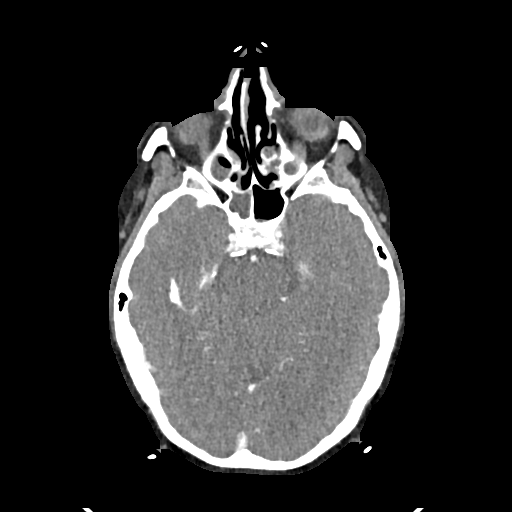
[im 550/734  soft-tissue]
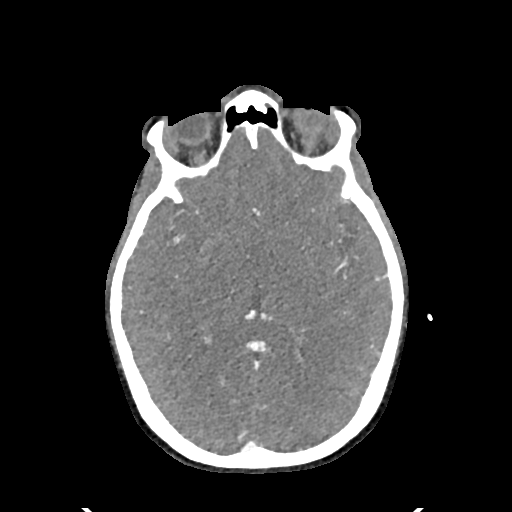
[im 550/734  lung]
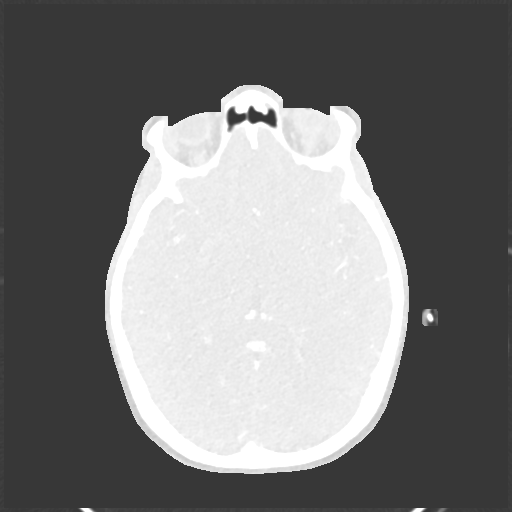
[im 550/734  bone]
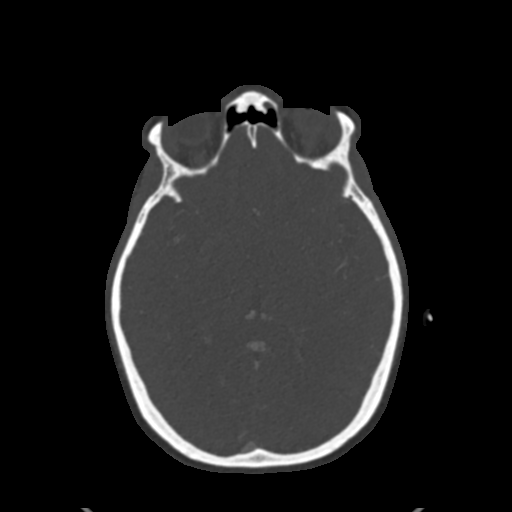
[im 596/734  soft-tissue]
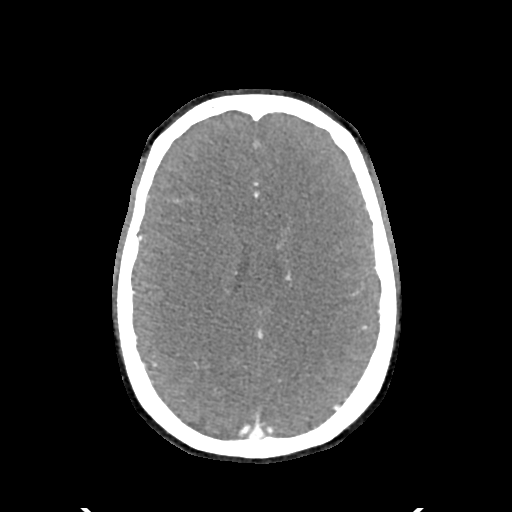
[im 596/734  lung]
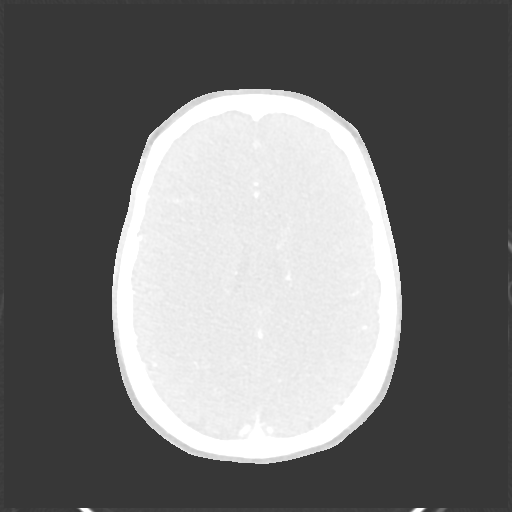
[im 642/734  lung]
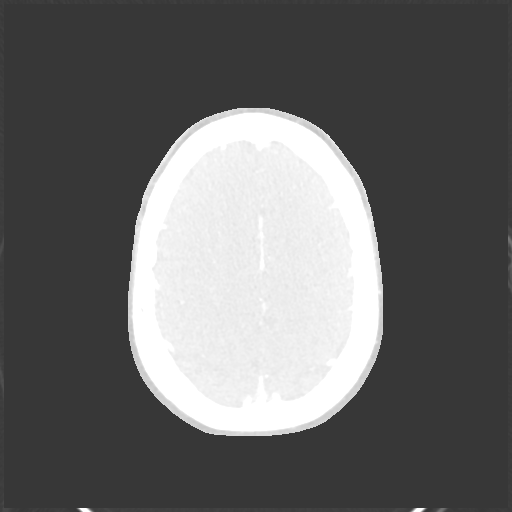
[im 688/734  soft-tissue]
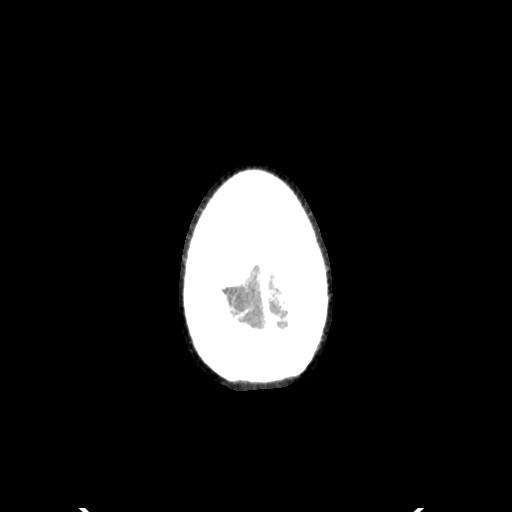
[im 688/734  lung]
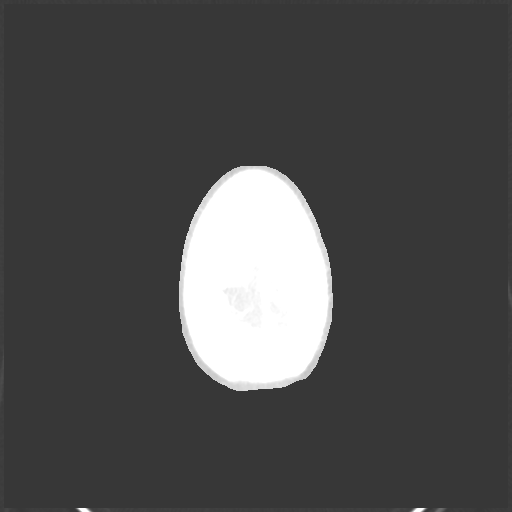

[12 of 46 positions shown; findings below may reference images not displayed]

FINDINGS: CT HEAD FINDINGS

Brain:

Cerebral volume is normal.

There is no acute intracranial hemorrhage.

No demarcated cortical infarct.

No extra-axial fluid collection.

No evidence of an intracranial mass.

No midline shift.

Vascular: No hyperdense vessel.

Skull: Normal. Negative for fracture or focal lesion.

Sinuses: Moderate mucosal thickening within the right sphenoid
sinus. Mild mucosal thickening within the left sphenoid sinus.
Complete opacification of the right sphenoid sinus. Mild mucosal
thickening within the left sphenoid sinus. Mucosal thickening and
fluid levels within the bilateral maxillary sinuses.

Orbits: No mass or acute finding.

Review of the MIP images confirms the above findings

CTA NECK FINDINGS

Aortic arch: Standard aortic branching. The visualized aortic arch
is normal in caliber. Streak and beam hardening artifact arising
from a dense right-sided contrast bolus partially obscures the
proximal right subclavian artery. Within this limitation, there is
no hemodynamically significant stenosis of the innominate or
proximal subclavian arteries.

Right carotid system: CCA and ICA patent within the neck without
stenosis or significant atherosclerotic disease. No evidence of
dissection.

Left carotid system: CCA and ICA patent within the neck without
stenosis or significant atherosclerotic disease. No evidence of
dissection.

Vertebral arteries: Codominant and patent within the neck without
stenosis. No evidence of dissection.

Skeleton: No neck mass or cervical lymphadenopathy.

Other neck: Cervical spondylosis.

Upper chest: No consolidation within the imaged lung apices.

Review of the MIP images confirms the above findings

CTA HEAD FINDINGS

Anterior circulation:

The intracranial internal carotid arteries are patent. The M1 middle
cerebral arteries are patent. No M2 proximal branch occlusion is
identified. Atherosclerotic irregularity of the M2 and more distal
middle cerebral artery vessels bilaterally. Most notably, there is a
severe focal stenosis within a mid M2 right MCA vessel (series 14,
image 12). The anterior cerebral arteries are patent. No
intracranial aneurysm is identified.

Posterior circulation:

The posterior cerebral arteries are patent. Mild atherosclerotic
irregularity of the V4 vertebral arteries bilaterally without
stenosis The basilar artery is patent. The posterior cerebral
arteries are patent. Posterior communicating arteries are
hypoplastic or absent bilaterally.

Venous sinuses: Within the limitations of contrast timing, no
convincing thrombus.

Anatomic variants: As described

Review of the MIP images confirms the above findings
IMPRESSION: CT head:

1. No evidence of acute intracranial abnormality.
2. Paranasal sinus disease, as described. Correlate for acute
sinusitis.

CTA neck:

1. The common carotid, internal carotid and vertebral arteries are
patent within the neck without stenosis. No evidence of dissection.
2. Cervical spondylosis.

CTA head:

1. No intracranial large vessel occlusion is identified.
2. Intracranial atherosclerotic disease, as described. Most notably,
there is a severe focal stenosis within a mid M2 right MCA vessel.

## 2022-08-09 ENCOUNTER — Ambulatory Visit (HOSPITAL_BASED_OUTPATIENT_CLINIC_OR_DEPARTMENT_OTHER): Payer: BC Managed Care – PPO

## 2022-08-20 ENCOUNTER — Ambulatory Visit (HOSPITAL_BASED_OUTPATIENT_CLINIC_OR_DEPARTMENT_OTHER)
Admission: RE | Admit: 2022-08-20 | Discharge: 2022-08-20 | Disposition: A | Discharge status: Home / Self Care | Payer: BC Managed Care – PPO | Source: Ambulatory Visit | Attending: Advanced Practice Midwife | Admitting: Advanced Practice Midwife

## 2022-08-20 DIAGNOSIS — Z1231 Encounter for screening mammogram for malignant neoplasm of breast: Secondary | ICD-10-CM | POA: Diagnosis present

## 2022-10-28 ENCOUNTER — Other Ambulatory Visit: Payer: Self-pay

## 2022-10-28 ENCOUNTER — Emergency Department (HOSPITAL_BASED_OUTPATIENT_CLINIC_OR_DEPARTMENT_OTHER): Payer: BC Managed Care – PPO | Admitting: Radiology

## 2022-10-28 ENCOUNTER — Encounter (HOSPITAL_BASED_OUTPATIENT_CLINIC_OR_DEPARTMENT_OTHER): Payer: Self-pay | Admitting: Emergency Medicine

## 2022-10-28 DIAGNOSIS — Z5321 Procedure and treatment not carried out due to patient leaving prior to being seen by health care provider: Secondary | ICD-10-CM | POA: Diagnosis not present

## 2022-10-28 DIAGNOSIS — R002 Palpitations: Secondary | ICD-10-CM | POA: Diagnosis not present

## 2022-10-28 DIAGNOSIS — R0789 Other chest pain: Secondary | ICD-10-CM | POA: Insufficient documentation

## 2022-10-28 LAB — CBC
HCT: 42.1 % (ref 36.0–46.0)
Hemoglobin: 14 g/dL (ref 12.0–15.0)
MCH: 30.5 pg (ref 26.0–34.0)
MCHC: 33.3 g/dL (ref 30.0–36.0)
MCV: 91.7 fL (ref 80.0–100.0)
Platelets: 223 10*3/uL (ref 150–400)
RBC: 4.59 MIL/uL (ref 3.87–5.11)
RDW: 12.8 % (ref 11.5–15.5)
WBC: 11.9 10*3/uL — ABNORMAL HIGH (ref 4.0–10.5)
nRBC: 0 % (ref 0.0–0.2)

## 2022-10-28 NOTE — ED Triage Notes (Signed)
Pt c/o left sided chest pain with radiation to left shoulder x 4 hours. Pt states that it feels like palpitations.

## 2022-10-29 ENCOUNTER — Emergency Department (HOSPITAL_BASED_OUTPATIENT_CLINIC_OR_DEPARTMENT_OTHER)
Admission: EM | Admit: 2022-10-29 | Discharge: 2022-10-29 | Discharge status: Against Medical Advice | Payer: BC Managed Care – PPO | Attending: Emergency Medicine | Admitting: Emergency Medicine

## 2022-10-29 LAB — BASIC METABOLIC PANEL
Anion gap: 9 (ref 5–15)
BUN: 11 mg/dL (ref 6–20)
CO2: 22 mmol/L (ref 22–32)
Calcium: 9.6 mg/dL (ref 8.9–10.3)
Chloride: 107 mmol/L (ref 98–111)
Creatinine, Ser: 0.59 mg/dL (ref 0.44–1.00)
GFR, Estimated: >60 mL/min (ref >60)
Glucose, Bld: 136 mg/dL — ABNORMAL HIGH (ref 70–99)
Potassium: 3.4 mmol/L — ABNORMAL LOW (ref 3.5–5.1)
Sodium: 138 mmol/L (ref 135–145)

## 2022-10-29 LAB — PREGNANCY, URINE: Preg Test, Ur: NEGATIVE

## 2022-10-29 LAB — TROPONIN I (HIGH SENSITIVITY): Troponin I (High Sensitivity): <2 ng/L (ref <18)

## 2022-10-29 LAB — D-DIMER, QUANTITATIVE: D-Dimer, Quant: <0.27 ug/mL-FEU (ref 0.00–0.50)

## 2023-03-05 ENCOUNTER — Emergency Department (HOSPITAL_BASED_OUTPATIENT_CLINIC_OR_DEPARTMENT_OTHER)
Admission: EM | Admit: 2023-03-05 | Discharge: 2023-03-06 | Disposition: A | Discharge status: Home / Self Care | Payer: BC Managed Care – PPO | Attending: Emergency Medicine | Admitting: Emergency Medicine

## 2023-03-05 ENCOUNTER — Other Ambulatory Visit: Payer: Self-pay

## 2023-03-05 ENCOUNTER — Encounter (HOSPITAL_BASED_OUTPATIENT_CLINIC_OR_DEPARTMENT_OTHER): Payer: Self-pay

## 2023-03-05 DIAGNOSIS — R002 Palpitations: Secondary | ICD-10-CM | POA: Diagnosis present

## 2023-03-05 DIAGNOSIS — I493 Ventricular premature depolarization: Secondary | ICD-10-CM | POA: Diagnosis not present

## 2023-03-05 LAB — CBC WITH DIFFERENTIAL/PLATELET
Abs Immature Granulocytes: 0.02 10*3/uL (ref 0.00–0.07)
Basophils Absolute: 0.1 10*3/uL (ref 0.0–0.1)
Basophils Relative: 1 %
Eosinophils Absolute: 0.9 10*3/uL — ABNORMAL HIGH (ref 0.0–0.5)
Eosinophils Relative: 9 %
HCT: 39.5 % (ref 36.0–46.0)
Hemoglobin: 13.4 g/dL (ref 12.0–15.0)
Immature Granulocytes: 0 %
Lymphocytes Relative: 33 %
Lymphs Abs: 3.1 10*3/uL (ref 0.7–4.0)
MCH: 31.5 pg (ref 26.0–34.0)
MCHC: 33.9 g/dL (ref 30.0–36.0)
MCV: 92.7 fL (ref 80.0–100.0)
Monocytes Absolute: 0.6 10*3/uL (ref 0.1–1.0)
Monocytes Relative: 7 %
Neutro Abs: 4.6 10*3/uL (ref 1.7–7.7)
Neutrophils Relative %: 50 %
Platelets: 206 10*3/uL (ref 150–400)
RBC: 4.26 MIL/uL (ref 3.87–5.11)
RDW: 13.2 % (ref 11.5–15.5)
WBC: 9.2 10*3/uL (ref 4.0–10.5)
nRBC: 0 % (ref 0.0–0.2)

## 2023-03-05 LAB — BASIC METABOLIC PANEL
Anion gap: 9 (ref 5–15)
BUN: 19 mg/dL (ref 6–20)
CO2: 21 mmol/L — ABNORMAL LOW (ref 22–32)
Calcium: 9.2 mg/dL (ref 8.9–10.3)
Chloride: 108 mmol/L (ref 98–111)
Creatinine, Ser: 0.62 mg/dL (ref 0.44–1.00)
GFR, Estimated: >60 mL/min (ref >60)
Glucose, Bld: 96 mg/dL (ref 70–99)
Potassium: 3.6 mmol/L (ref 3.5–5.1)
Sodium: 138 mmol/L (ref 135–145)

## 2023-03-05 LAB — MAGNESIUM: Magnesium: 1.9 mg/dL (ref 1.7–2.4)

## 2023-03-05 LAB — TROPONIN I (HIGH SENSITIVITY): Troponin I (High Sensitivity): <2 ng/L (ref <18)

## 2023-03-05 MED ORDER — METOPROLOL TARTRATE 25 MG PO TABS
25.0000 mg | ORAL_TABLET | Freq: Once | ORAL | Status: AC
  Administered 2023-03-05: 25 mg via ORAL
  Filled 2023-03-05: qty 1

## 2023-03-05 NOTE — ED Provider Notes (Signed)
[place] EMERGENCY DEPARTMENT AT [facility]  Provider Note  CSN: [id number] Arrival date & time: 03/05/23 2237  History Chief Complaint  Patient presents with   Chest Pain    Adrienne [name] is a 42 y.o. female with history of SVT has been having palpitations off and on for the last several weeks. Has been wearing a remote heart monitor for the last 2 weeks from her cardiologist at [facility]. She began having tachypalpitations during the day on Thursday (3 days ago) and was called the following day (while she was out of town) and told she had an episode of Afib with RVR. She was prescribed Metoprolol and Eliquis which she just picked up earlier tonight and has not started taking yet. Today after getting home from her trip, she has noticed intermittent episodes of her heart skipping beats. Has not had any rapid heart rate. No CP or SOB. Mild chest pressure she attributed to being anxious. She was told by her cardiologist to come to the ED if her symptoms returned.    Home Medications Prior to Admission medications   Medication Sig Start Date End Date Taking? Authorizing Provider  albuterol (VENTOLIN HFA) 108 (90 Base) MCG/ACT inhaler Inhale 2 puffs into the lungs every 6 (six) hours as needed for shortness of breath or wheezing.    [provider]  aspirin 81 MG chewable tablet Chew by mouth daily.    [provider]  cetirizine (ZYRTEC) 10 MG tablet Take 10 mg by mouth in the morning.    [provider]  Drospirenone (SLYND) 4 MG TABS Take 1 tablet by mouth daily at 6 (six) AM. 06/08/22   Leftwich-Kirby, Wilmer Floor, CNM  finasteride (PROSCAR) 5 MG tablet Take 2.5 mg by mouth in the morning.    [provider]  ibuprofen (ADVIL) 200 MG tablet Take 200-400 mg by mouth every 6 (six) hours as needed for mild pain or headache.    [provider]  levothyroxine (SYNTHROID) 75 MCG tablet Take 75 mcg by mouth at bedtime.    [provider]   varenicline (CHANTIX) 1 MG tablet Take 1 mg by mouth 2 (two) times daily.    [provider]     Allergies    Wound dressing adhesive, Penicillins, Morphine, Morphine and related, Sunflower oil, and Tape   Review of Systems   Review of Systems Please see HPI for pertinent positives and negatives  Physical Exam BP 126/74   Pulse 82   Temp 97.7 F (36.5 C) (Oral)   Resp 15   Ht  (1.626 m)   Wt 90.7 kg   LMP 03/03/2023 (Exact Date)   SpO2 99%   BMI 34.33 kg/m   Physical Exam Vitals and nursing note reviewed.  Constitutional:      Appearance: Normal appearance.  HENT:     Head: Normocephalic and atraumatic.     Nose: Nose normal.     Mouth/Throat:     Mouth: Mucous membranes are moist.  Eyes:     Extraocular Movements: Extraocular movements intact.     Conjunctiva/sclera: Conjunctivae normal.  Cardiovascular:     Rate and Rhythm: Normal rate and regular rhythm.  Pulmonary:     Effort: Pulmonary effort is normal.     Breath sounds: Normal breath sounds.  Abdominal:     General: Abdomen is flat.     Palpations: Abdomen is soft.     Tenderness: There is no abdominal tenderness.  Musculoskeletal:  General: No swelling. Normal range of motion.     Cervical back: Neck supple.  Skin:    General: Skin is warm and dry.  Neurological:     General: No focal deficit present.     Mental Status: She is alert.  Psychiatric:        Mood and Affect: Mood normal.     ED Results / Procedures / Treatments   EKG EKG Interpretation  Date/Time:  Saturday March 05 2023 22:44:36 EDT Ventricular Rate:  86 PR Interval:  138 QRS Duration: 116 QT Interval:  379 QTC Calculation: 454 R Axis:   93 Text Interpretation: Sinus rhythm Multiple ventricular premature complexes Nonspecific intraventricular conduction delay Since last tracing Rate slower Premature ventricular complexes NOW PRESENT Confirmed by Susy Frizzle [phone]) on 03/05/2023 11:00:43  PM  Procedures Procedures  Medications Ordered in the ED Medications  metoprolol tartrate (LOPRESSOR) tablet 25 mg (has no administration in time range)    Initial Impression and Plan  Patient here with a reported episode of afib with RVR a few days ago on remote heart monitor. Now with continued occasional palpitations and mild chest pressure. On EKG and monitor here, she is in NSR but has some PVCs which correlate with her symptoms. Will check labs and continue to monitor. Will give first dose of Metoprolol tonight since she hasn't started it yet.   ED Course       MDM Rules/Calculators/A&P Medical Decision Making Amount and/or Complexity of Data Reviewed Labs: ordered.  Risk Prescription drug management.     Final Clinical Impression(s) / ED Diagnoses Final diagnoses:  None    Rx / DC Orders ED Discharge Orders     None

## 2023-03-05 NOTE — ED Triage Notes (Signed)
Says she was laying in bed and developed some central chest pressure that began ~1 hour pta.   Wearing a heart monitor from her cardiologist. Received a phone call from doctor yesterday morning and told she was in atrial fibrillation.   Was encouraged to ER if sensation occurred again.   Was prescribed Eliquis, and Metoprolol after the event.

## 2023-03-06 NOTE — ED Notes (Signed)
D/c instructions provided at length to pt. Voiced understanding. Questions answered. Denies any cp/sob or palpations at d/c home.

## 2023-03-15 ENCOUNTER — Ambulatory Visit (HOSPITAL_BASED_OUTPATIENT_CLINIC_OR_DEPARTMENT_OTHER): Payer: BC Managed Care – PPO | Admitting: Advanced Practice Midwife

## 2023-03-15 ENCOUNTER — Encounter (HOSPITAL_BASED_OUTPATIENT_CLINIC_OR_DEPARTMENT_OTHER): Payer: Self-pay | Admitting: Advanced Practice Midwife

## 2023-03-15 VITALS — BP 105/78 | HR 85 | Ht 64.0 in | Wt 202.6 lb

## 2023-03-15 DIAGNOSIS — N939 Abnormal uterine and vaginal bleeding, unspecified: Secondary | ICD-10-CM

## 2023-03-15 MED ORDER — SLYND 4 MG PO TABS: 1.0000 | ORAL_TABLET | Freq: Every day | ORAL | 11 refills | Status: DC

## 2023-03-15 NOTE — Progress Notes (Signed)
   GYNECOLOGY PROGRESS NOTE  History:  42 y.o. [id number] presents to [facility] office today for problem gyn visit. She reports irregular, frequent, heavy menses.  It is hard to predict if bleeding will last 1 day or 2 weeks.  She is current smoker, BMI 34, and has new cardiac dx with palpitations and episode of atrial fibrillation.  She was unable to begin progesterone only OCPs, Slynd,  as prescribed 05/2023 because it was not covered by insurance.  She denies h/a, dizziness, shortness of breath, n/v, or fever/chills.    The following portions of the patient's history were reviewed and updated as appropriate: allergies, current medications, past family history, past medical history, past social history, past surgical history and problem list. Last pap smear on 06/08/22 was normal, neg HRHPV.  Health Maintenance Due  Topic Date Due   HIV Screening  Never done   Hepatitis C Screening  Never done   HPV VACCINES (2 - 3-dose SCDM series) 07/06/2022   COVID-19 Vaccine (4 - 2023-24 season) 07/16/2022   DTaP/Tdap/Td (2 - Td or Tdap) 08/30/2022     Review of Systems:  Pertinent items are noted in HPI.   Objective:  Physical Exam Blood pressure 105/78, pulse 85, height 5\' 4"  (1.626 m), weight 202 lb 9.6 oz (91.9 kg), last menstrual period 03/03/2023. VS reviewed, nursing note reviewed,  Constitutional: well developed, well nourished, no distress HEENT: normocephalic CV: normal rate Pulm/chest wall: normal effort Breast Exam: deferred Abdomen: soft Neuro: alert and oriented x 3 Skin: warm, dry Psych: affect normal Pelvic exam: Deferred  Assessment & Plan:  1. Abnormal uterine bleeding (AUB) --This is one or more chronic illnesses with exacerbation, progression, or side effect of treatment.  --CBC and TSH both recently evaluated and wnl --Discussed management options including medications (both progesterone OCPs and Provera/Megace), IUD, and surgical options. --Pt was unable to get  Slynd when prescribed 05/2022 due to insurance.  I offered to send to [facility] mail order pharmacy to work on prior authorization/lower costs.  Pt prefers Slynd to other oral progesterone options.  If this does not help, she will consider IUD or surgical options.  --Outpatient [place], follow up dependent upon [place] results  - Drospirenone (SLYND) 4 MG TABS; Take 1 tablet (4 mg total) by mouth daily at 6 (six) AM.  Dispense: 28 tablet; Refill: 11   Return for [place] as scheduled.   Sharen Counter, CNM 6:09 PM

## 2023-03-16 ENCOUNTER — Other Ambulatory Visit (HOSPITAL_BASED_OUTPATIENT_CLINIC_OR_DEPARTMENT_OTHER): Payer: Self-pay | Admitting: Obstetrics & Gynecology

## 2023-03-16 DIAGNOSIS — N920 Excessive and frequent menstruation with regular cycle: Secondary | ICD-10-CM

## 2023-03-16 DIAGNOSIS — N921 Excessive and frequent menstruation with irregular cycle: Secondary | ICD-10-CM

## 2023-03-29 ENCOUNTER — Encounter (HOSPITAL_BASED_OUTPATIENT_CLINIC_OR_DEPARTMENT_OTHER): Payer: Self-pay | Admitting: *Deleted

## 2023-03-30 ENCOUNTER — Encounter (HOSPITAL_BASED_OUTPATIENT_CLINIC_OR_DEPARTMENT_OTHER): Payer: Self-pay

## 2023-03-30 ENCOUNTER — Ambulatory Visit (INDEPENDENT_AMBULATORY_CARE_PROVIDER_SITE_OTHER): Payer: BC Managed Care – PPO

## 2023-03-30 DIAGNOSIS — N920 Excessive and frequent menstruation with regular cycle: Secondary | ICD-10-CM

## 2023-03-30 DIAGNOSIS — N921 Excessive and frequent menstruation with irregular cycle: Secondary | ICD-10-CM

## 2023-03-30 DIAGNOSIS — N939 Abnormal uterine and vaginal bleeding, unspecified: Secondary | ICD-10-CM | POA: Diagnosis not present

## 2023-04-12 ENCOUNTER — Emergency Department (HOSPITAL_BASED_OUTPATIENT_CLINIC_OR_DEPARTMENT_OTHER): Payer: BC Managed Care – PPO

## 2023-04-12 ENCOUNTER — Other Ambulatory Visit: Payer: Self-pay

## 2023-04-12 ENCOUNTER — Emergency Department (HOSPITAL_BASED_OUTPATIENT_CLINIC_OR_DEPARTMENT_OTHER)
Admission: EM | Admit: 2023-04-12 | Discharge: 2023-04-12 | Disposition: A | Discharge status: Home / Self Care | Payer: BC Managed Care – PPO | Attending: Emergency Medicine | Admitting: Emergency Medicine

## 2023-04-12 ENCOUNTER — Encounter (HOSPITAL_BASED_OUTPATIENT_CLINIC_OR_DEPARTMENT_OTHER): Payer: Self-pay

## 2023-04-12 DIAGNOSIS — R000 Tachycardia, unspecified: Secondary | ICD-10-CM | POA: Insufficient documentation

## 2023-04-12 LAB — BASIC METABOLIC PANEL
Anion gap: 9 (ref 5–15)
BUN: 15 mg/dL (ref 6–20)
CO2: 22 mmol/L (ref 22–32)
Calcium: 9.7 mg/dL (ref 8.9–10.3)
Chloride: 107 mmol/L (ref 98–111)
Creatinine, Ser: 0.64 mg/dL (ref 0.44–1.00)
GFR, Estimated: >60 mL/min (ref >60)
Glucose, Bld: 122 mg/dL — ABNORMAL HIGH (ref 70–99)
Potassium: 3.8 mmol/L (ref 3.5–5.1)
Sodium: 138 mmol/L (ref 135–145)

## 2023-04-12 LAB — CBC
HCT: 42.9 % (ref 36.0–46.0)
Hemoglobin: 14.5 g/dL (ref 12.0–15.0)
MCH: 31.4 pg (ref 26.0–34.0)
MCHC: 33.8 g/dL (ref 30.0–36.0)
MCV: 92.9 fL (ref 80.0–100.0)
Platelets: 217 10*3/uL (ref 150–400)
RBC: 4.62 MIL/uL (ref 3.87–5.11)
RDW: 12.9 % (ref 11.5–15.5)
WBC: 8.6 10*3/uL (ref 4.0–10.5)
nRBC: 0 % (ref 0.0–0.2)

## 2023-04-12 LAB — HCG, SERUM, QUALITATIVE: Preg, Serum: NEGATIVE

## 2023-04-12 LAB — MAGNESIUM: Magnesium: 1.9 mg/dL (ref 1.7–2.4)

## 2023-04-12 LAB — D-DIMER, QUANTITATIVE: D-Dimer, Quant: <0.27 ug/mL-FEU (ref 0.00–0.50)

## 2023-04-12 MED ORDER — SODIUM CHLORIDE 0.9 % IV BOLUS
1000.0000 mL | Freq: Once | INTRAVENOUS | Status: AC
  Administered 2023-04-12: 1000 mL via INTRAVENOUS

## 2023-04-12 NOTE — Discharge Instructions (Signed)
You were evaluated today for tachycardia.  Your heart rate normalized while you were here.  Your rhythm showed sinus rhythm with occasional PVCs.  Your workup was reassuring overall.  Please follow-up with your cardiologist for further evaluation and management.  If you develop any life-threatening symptoms please return to the emergency department.

## 2023-04-12 NOTE — ED Provider Notes (Signed)
[place] EMERGENCY DEPARTMENT AT [facility] Provider Note   CSN: [id number] Arrival date & time: 04/12/23  1557     History  Chief Complaint  Patient presents with   Tachycardia    Adrienne [name] is a 42 y.o. female.  Patient presents to the emergency department complaining of a 20-minute episode of tachycardia which occurred approximately 1 hour ago.  Patient says she was taking the trash out and began to feel short of breath and noticed her heart rate went up to the 130s.  Patient states has a history of A-fib with RVR and has been evaluated by cardiology for the same.  She currently takes 50 mg metoprolol in the morning and 25 mg nightly.  She denies chest pain at this time.  Endorses mild shortness of breath.  Heart rate has now come down to approximately 100.  Denies other complaints.  Past medical history significant for SVT, atrial fibrillation with RVR, AUB, anxiety and depression  HPI     Home Medications Prior to Admission medications   Medication Sig Start Date End Date Taking? Authorizing Provider  albuterol (VENTOLIN HFA) 108 (90 Base) MCG/ACT inhaler Inhale 2 puffs into the lungs every 6 (six) hours as needed for shortness of breath or wheezing.    [provider]  apixaban (ELIQUIS) 5 MG TABS tablet Take by mouth. 02/28/23   [provider]  Drospirenone (SLYND) 4 MG TABS Take 1 tablet (4 mg total) by mouth daily at 6 (six) AM. 03/15/23   Leftwich-Kirby, Wilmer Floor, CNM  fexofenadine (ALLEGRA ALLERGY) 180 MG tablet  12/16/22   [provider]  finasteride (PROSCAR) 5 MG tablet Take 2.5 mg by mouth in the morning.    [provider]  ibuprofen (ADVIL) 200 MG tablet Take 200-400 mg by mouth every 6 (six) hours as needed for mild pain or headache.    [provider]  levothyroxine (SYNTHROID) 75 MCG tablet Take 75 mcg by mouth at bedtime.    [provider]  metoprolol tartrate (LOPRESSOR) 25 MG tablet Take by mouth.  03/04/23   [provider]  potassium chloride (KLOR-CON M) 10 MEQ tablet Take by mouth. 02/16/23   [provider]  varenicline (CHANTIX) 1 MG tablet Take 1 mg by mouth 2 (two) times daily.    [provider]  ZOLOFT 25 MG tablet  11/19/21   [provider]      Allergies    Wound dressing adhesive, Penicillins, Morphine, Morphine and codeine, Sunflower oil, and Tape    Review of Systems   Review of Systems  Physical Exam Updated Vital Signs BP 116/85   Pulse (!) 102   Temp (!) 97.1 F (36.2 C) (Temporal)   Resp 19   Ht 5\' 4"  (1.626 m)   Wt 89.4 kg   SpO2 97%   BMI 33.81 kg/m  Physical Exam Vitals and nursing note reviewed.  Constitutional:      General: She is not in acute distress.    Appearance: She is well-developed.  HENT:     Head: Normocephalic and atraumatic.  Eyes:     Conjunctiva/sclera: Conjunctivae normal.  Cardiovascular:     Rate and Rhythm: Normal rate and regular rhythm.     Heart sounds: No murmur heard.    Comments: Borderline tachycardia Pulmonary:     Effort: Pulmonary effort is normal. No respiratory distress.     Breath sounds: Normal breath sounds.  Abdominal:     Palpations: Abdomen  is soft.     Tenderness: There is no abdominal tenderness.  Musculoskeletal:        General: No swelling.     Cervical back: Neck supple.     Right lower leg: No edema.     Left lower leg: No edema.  Skin:    General: Skin is warm and dry.     Capillary Refill: Capillary refill takes less than 2 seconds.  Neurological:     Mental Status: She is alert.  Psychiatric:        Mood and Affect: Mood normal.     ED Results / Procedures / Treatments   Labs (all labs ordered are listed, but only abnormal results are displayed) Labs Reviewed  BASIC METABOLIC PANEL - Abnormal; Notable for the following components:      Result Value   Glucose, Bld 122 (*)    All other components within normal limits  CBC  HCG, SERUM, QUALITATIVE   MAGNESIUM  D-DIMER, QUANTITATIVE (NOT AT [facility])    EKG None  Radiology DG Chest Portable 1 View  Result Date: 04/12/2023 CLINICAL DATA:  Shortness of breath EXAM: PORTABLE CHEST 1 VIEW COMPARISON:  X-ray 10/28/2022 FINDINGS: No consolidation, pneumothorax or effusion. No edema. Normal cardiopericardial silhouette. Overlapping cardiac leads. IMPRESSION: No acute cardiopulmonary disease. Electronically Signed   By: Karen Kays M.D.   On: 04/12/2023 16:38    Procedures Procedures    Medications Ordered in ED Medications  sodium chloride 0.9 % bolus 1,000 mL (1,000 mLs Intravenous New Bag/Given 04/12/23 1636)    ED Course/ Medical Decision Making/ A&P                             Medical Decision Making Amount and/or Complexity of Data Reviewed Labs: ordered. Radiology: ordered.   This patient presents to the ED for concern of tachycardia, this involves an extensive number of treatment options, and is a complaint that carries with it a high risk of complications and morbidity.  The differential diagnosis includes sinus tachycardia, atrial fibrillation with RVR, SVT, pulmonary embolism, dehydration, others   Co morbidities that complicate the patient evaluation  History of palpitations, SVT, reported atrial fibrillation with RVR   Additional history obtained:  Additional history obtained from visitor at bedside External records from outside source obtained and reviewed including cardiology notes   Lab Tests:  I Ordered, and personally interpreted labs.  The pertinent results include: Unremarkable BMP, CBC, negative pregnancy test, unremarkable magnesium, negative D-dimer   Imaging Studies ordered:  I ordered imaging studies including chest x-ray I independently visualized and interpreted imaging which showed no acute findings I agree with the radiologist interpretation   Cardiac Monitoring: / EKG:  The patient was maintained on a cardiac monitor.  I personally  viewed and interpreted the cardiac monitored which showed an underlying rhythm of: Sinus tachycardia with a rate of 103, occasional PVCs    Problem List / ED Course / Critical interventions / Medication management   I ordered medication including normal saline for fluid resuscitation Reevaluation of the patient after these medicines showed that the patient improved I have reviewed the patients home medicines and have made adjustments as needed   Test / Admission - Considered:  Patient is asymptomatic at this time with no chest pain, no shortness of breath.  Patient has had no chest pain since the onset of initial tachycardia.  Tachycardia has resolved and heart rate is now in  the 80s.  Unclear as to what rhythm the patient may have had with her earlier tachycardia but it is now resolved.  Patient with known history of intermittent tachycardia currently followed by cardiology.  Plan to discharge at this time with recommendations for follow-up with cardiology.  Patient given return precautions including shortness of breath or chest pain.  Discharge home.         Final Clinical Impression(s) / ED Diagnoses Final diagnoses:  Tachycardia    Rx / DC Orders ED Discharge Orders     None         Pamala Duffel 04/12/23 1714    Benjiman Core, MD 04/12/23 2337

## 2023-04-12 NOTE — ED Triage Notes (Signed)
Patient here POV from Home.  Endorses Onset of Tachycardia (130) an hour ago when she took the trash out. Now feels dizzy and "swimmy-headed". No Pain. Some SOB.   NAD Noted during Triage. A&Ox4. GCS 15. Ambulatory.

## 2023-04-25 ENCOUNTER — Encounter (HOSPITAL_BASED_OUTPATIENT_CLINIC_OR_DEPARTMENT_OTHER): Payer: Self-pay | Admitting: Advanced Practice Midwife

## 2023-04-28 ENCOUNTER — Encounter (HOSPITAL_BASED_OUTPATIENT_CLINIC_OR_DEPARTMENT_OTHER): Payer: Self-pay | Admitting: Obstetrics & Gynecology

## 2023-04-28 ENCOUNTER — Ambulatory Visit (HOSPITAL_BASED_OUTPATIENT_CLINIC_OR_DEPARTMENT_OTHER): Payer: BC Managed Care – PPO | Admitting: Obstetrics & Gynecology

## 2023-04-28 ENCOUNTER — Other Ambulatory Visit (HOSPITAL_COMMUNITY)
Admission: RE | Admit: 2023-04-28 | Discharge: 2023-04-28 | Disposition: A | Discharge status: Home / Self Care | Payer: BC Managed Care – PPO | Source: Ambulatory Visit | Attending: Obstetrics & Gynecology | Admitting: Obstetrics & Gynecology

## 2023-04-28 VITALS — BP 109/79 | HR 93 | Ht 64.0 in | Wt 200.0 lb

## 2023-04-28 DIAGNOSIS — N921 Excessive and frequent menstruation with irregular cycle: Secondary | ICD-10-CM

## 2023-04-28 MED ORDER — NORETHINDRONE ACETATE 5 MG PO TABS: 10.0000 mg | ORAL_TABLET | Freq: Two times a day (BID) | ORAL | 1 refills | Status: DC

## 2023-04-28 NOTE — Progress Notes (Signed)
GYNECOLOGY  VISIT  CC:   heavy bleeding  HPI: 42 y.o. [id number] Married White or Caucasian female here for complaint of heavy bleeding.  Reports cycles are not regular.  She can have two bleeding episodes in a month and sometimes just one.  Bleeding start 04/09/2023 and is still going on right now.  Flow is heavy and she is passing clots.   She started eliquis in march due to afib.  She thinks she is going to need to be on this long term.    She started slynd two months ago.  It doesn't seem to be doing anything right now for her.    Cycle last July was normal with neg HR HPV.   Had ultrasound in May with 3.4cm intramural fibroid.  Endometrium was 7.34mm at that time.     Past Medical History:  Diagnosis Date   Asthma    SVT (supraventricular tachycardia)    Thyroid disease     MEDS:   Current Outpatient Medications on File Prior to Visit  Medication Sig Dispense Refill   albuterol (VENTOLIN HFA) 108 (90 Base) MCG/ACT inhaler Inhale 2 puffs into the lungs every 6 (six) hours as needed for shortness of breath or wheezing.     apixaban (ELIQUIS) 5 MG TABS tablet Take by mouth.     Drospirenone (SLYND) 4 MG TABS Take 1 tablet (4 mg total) by mouth daily at 6 (six) AM. 28 tablet 11   fexofenadine (ALLEGRA ALLERGY) 180 MG tablet      finasteride (PROSCAR) 5 MG tablet Take 2.5 mg by mouth in the morning.     ibuprofen (ADVIL) 200 MG tablet Take 200-400 mg by mouth every 6 (six) hours as needed for mild pain or headache.     levothyroxine (SYNTHROID) 75 MCG tablet Take 75 mcg by mouth at bedtime.     metoprolol tartrate (LOPRESSOR) 25 MG tablet Take by mouth.     potassium chloride (KLOR-CON M) 10 MEQ tablet Take by mouth.     ZOLOFT 25 MG tablet      No current facility-administered medications on file prior to visit.    ALLERGIES: Wound dressing adhesive, Penicillins, Morphine, Morphine and codeine, Sunflower oil, and Tape  SH:  married, non smoker  Review of Systems   Constitutional: Negative.   Genitourinary:        Heavy bleeding    PHYSICAL EXAMINATION:    BP 109/79 (BP Location: Left Arm, Patient Position: Sitting, Cuff Size: Large)   Pulse 93   Ht 5\' 4"  (1.626 m) Comment: Reported  Wt 200 lb (90.7 kg)   BMI 34.33 kg/m     General appearance: alert, cooperative and appears stated age Lymph:  no inguinal LAD noted  Pelvic: External genitalia:  no lesions              Urethra:  normal appearing urethra with no masses, tenderness or lesions              Bartholins and Skenes: normal                 Vagina: normal appearing vagina with normal color and discharge, no lesions              Cervix: no lesions and active but dark bleeding noted              Bimanual Exam:  Uterus:  normal size, contour, position, consistency, mobility, non-tender  Adnexa: no mass, fullness, tenderness  Endometrial biopsy recommended.  Discussed with patient.  Verbal and written consent obtained.   Procedure:  Speculum placed.  Cervix visualized and cleansed with betadine prep.  A single toothed tenaculum was applied to the anterior lip of the cervix.  Endometrial pipelle was advanced through the cervix into the endometrial cavity without difficulty.  Pipelle passed to 8cm.  Suction applied and pipelle removed with good tissue sample obtained.  Tenculum removed.  No bleeding noted.  Patient tolerated procedure well.   Chaperone, Raechel Ache, RN, was present for exam.  Assessment/Plan: 1. Menorrhagia with irregular cycle - will stop slynd and start norethindrone 10mg  bid.  Rx to pharmacy. - CBC - Iron, TIBC and Ferritin Panel - Surgical pathology( [place]/ [facility])

## 2023-04-29 LAB — CBC
Hematocrit: 43.5 % (ref 34.0–46.6)
Hemoglobin: 14.2 g/dL (ref 11.1–15.9)
MCH: 30.4 pg (ref 26.6–33.0)
MCHC: 32.6 g/dL (ref 31.5–35.7)
MCV: 93 fL (ref 79–97)
Platelets: 235 10*3/uL (ref 150–450)
RBC: 4.67 x10E6/uL (ref 3.77–5.28)
RDW: 12 % (ref 11.7–15.4)
WBC: 10.2 10*3/uL (ref 3.4–10.8)

## 2023-04-29 LAB — IRON,TIBC AND FERRITIN PANEL
Ferritin: 41 ng/mL (ref 15–150)
Iron Saturation: 21 % (ref 15–55)
Iron: 64 ug/dL (ref 27–159)
Total Iron Binding Capacity: 299 ug/dL (ref 250–450)
UIBC: 235 ug/dL (ref 131–425)

## 2023-05-02 LAB — SURGICAL PATHOLOGY

## 2023-05-26 ENCOUNTER — Other Ambulatory Visit (HOSPITAL_BASED_OUTPATIENT_CLINIC_OR_DEPARTMENT_OTHER): Payer: Self-pay | Admitting: Obstetrics & Gynecology

## 2023-06-07 ENCOUNTER — Ambulatory Visit (HOSPITAL_BASED_OUTPATIENT_CLINIC_OR_DEPARTMENT_OTHER): Payer: BC Managed Care – PPO | Admitting: Advanced Practice Midwife

## 2023-06-07 ENCOUNTER — Ambulatory Visit (HOSPITAL_BASED_OUTPATIENT_CLINIC_OR_DEPARTMENT_OTHER): Payer: BC Managed Care – PPO | Admitting: Obstetrics & Gynecology

## 2023-06-08 ENCOUNTER — Ambulatory Visit (HOSPITAL_BASED_OUTPATIENT_CLINIC_OR_DEPARTMENT_OTHER): Payer: BC Managed Care – PPO | Admitting: Obstetrics & Gynecology

## 2023-07-22 ENCOUNTER — Other Ambulatory Visit (HOSPITAL_BASED_OUTPATIENT_CLINIC_OR_DEPARTMENT_OTHER): Payer: Self-pay | Admitting: *Deleted

## 2023-07-22 MED ORDER — NORETHINDRONE ACETATE 5 MG PO TABS: 5.0000 mg | ORAL_TABLET | Freq: Every day | ORAL | 0 refills | Status: AC

## 2023-08-25 ENCOUNTER — Ambulatory Visit (HOSPITAL_BASED_OUTPATIENT_CLINIC_OR_DEPARTMENT_OTHER)
Admission: RE | Admit: 2023-08-25 | Discharge: 2023-08-25 | Disposition: A | Discharge status: Home / Self Care | Payer: BC Managed Care – PPO | Source: Ambulatory Visit | Attending: Family Medicine | Admitting: Family Medicine

## 2023-08-25 DIAGNOSIS — Z1231 Encounter for screening mammogram for malignant neoplasm of breast: Secondary | ICD-10-CM | POA: Insufficient documentation

## 2024-02-17 ENCOUNTER — Other Ambulatory Visit (HOSPITAL_BASED_OUTPATIENT_CLINIC_OR_DEPARTMENT_OTHER): Payer: Self-pay

## 2024-02-17 MED ORDER — SEMAGLUTIDE-WEIGHT MANAGEMENT 1 MG/0.5ML ~~LOC~~ SOAJ
1.0000 mg | SUBCUTANEOUS | 0 refills | Status: AC
  Filled 2024-02-17: qty 2, 28d supply, fill #0

## 2024-03-19 ENCOUNTER — Other Ambulatory Visit (HOSPITAL_BASED_OUTPATIENT_CLINIC_OR_DEPARTMENT_OTHER): Payer: Self-pay

## 2024-03-19 MED ORDER — WEGOVY 1.7 MG/0.75ML ~~LOC~~ SOAJ
1.7000 mg | SUBCUTANEOUS | 0 refills | Status: DC
  Filled 2024-03-19: qty 3, 28d supply, fill #0

## 2024-03-19 MED ORDER — FAMOTIDINE 20 MG PO TABS
20.0000 mg | ORAL_TABLET | Freq: Two times a day (BID) | ORAL | 1 refills | Status: AC
  Filled 2024-03-19: qty 60, 30d supply, fill #0

## 2024-04-13 ENCOUNTER — Other Ambulatory Visit (HOSPITAL_BASED_OUTPATIENT_CLINIC_OR_DEPARTMENT_OTHER): Payer: Self-pay

## 2024-04-13 MED ORDER — WEGOVY 1.7 MG/0.75ML ~~LOC~~ SOAJ
1.7000 mg | SUBCUTANEOUS | 0 refills | Status: DC
  Filled 2024-04-13: qty 3, 28d supply, fill #0

## 2024-05-11 ENCOUNTER — Other Ambulatory Visit (HOSPITAL_BASED_OUTPATIENT_CLINIC_OR_DEPARTMENT_OTHER): Payer: Self-pay

## 2024-05-11 MED ORDER — WEGOVY 1.7 MG/0.75ML ~~LOC~~ SOAJ
1.7000 mg | SUBCUTANEOUS | 0 refills | Status: DC
  Filled 2024-05-11: qty 3, 28d supply, fill #0

## 2024-06-04 ENCOUNTER — Other Ambulatory Visit (HOSPITAL_BASED_OUTPATIENT_CLINIC_OR_DEPARTMENT_OTHER): Payer: Self-pay

## 2024-06-04 MED ORDER — WEGOVY 2.4 MG/0.75ML ~~LOC~~ SOAJ
2.4000 mg | SUBCUTANEOUS | 0 refills | Status: AC
  Filled 2024-06-04: qty 3, 28d supply, fill #0

## 2024-06-08 ENCOUNTER — Other Ambulatory Visit (HOSPITAL_BASED_OUTPATIENT_CLINIC_OR_DEPARTMENT_OTHER): Payer: Self-pay

## 2024-07-04 ENCOUNTER — Other Ambulatory Visit (HOSPITAL_BASED_OUTPATIENT_CLINIC_OR_DEPARTMENT_OTHER): Payer: Self-pay

## 2024-07-04 MED ORDER — WEGOVY 1.7 MG/0.75ML ~~LOC~~ SOAJ
1.7000 mg | SUBCUTANEOUS | 0 refills | Status: DC
  Filled 2024-07-04: qty 3, 28d supply, fill #0

## 2024-07-31 ENCOUNTER — Other Ambulatory Visit: Payer: Self-pay

## 2024-07-31 ENCOUNTER — Other Ambulatory Visit (HOSPITAL_BASED_OUTPATIENT_CLINIC_OR_DEPARTMENT_OTHER): Payer: Self-pay

## 2024-07-31 MED ORDER — WEGOVY 1.7 MG/0.75ML ~~LOC~~ SOAJ
1.7000 mg | SUBCUTANEOUS | 0 refills | Status: DC
  Filled 2024-07-31: qty 3, 28d supply, fill #0

## 2024-08-02 ENCOUNTER — Other Ambulatory Visit (HOSPITAL_BASED_OUTPATIENT_CLINIC_OR_DEPARTMENT_OTHER): Payer: Self-pay

## 2024-08-06 ENCOUNTER — Other Ambulatory Visit (HOSPITAL_BASED_OUTPATIENT_CLINIC_OR_DEPARTMENT_OTHER): Payer: Self-pay

## 2024-08-06 MED ORDER — WEGOVY 1.7 MG/0.75ML ~~LOC~~ SOAJ
1.7000 mg | SUBCUTANEOUS | 0 refills | Status: DC
  Filled 2024-08-06: qty 3, 28d supply, fill #0

## 2024-09-03 ENCOUNTER — Other Ambulatory Visit (HOSPITAL_BASED_OUTPATIENT_CLINIC_OR_DEPARTMENT_OTHER): Payer: Self-pay

## 2024-09-03 MED ORDER — WEGOVY 2.4 MG/0.75ML ~~LOC~~ SOAJ
2.4000 mg | SUBCUTANEOUS | 0 refills | Status: DC
  Filled 2024-09-03: qty 3, 28d supply, fill #0

## 2024-09-24 ENCOUNTER — Other Ambulatory Visit (HOSPITAL_BASED_OUTPATIENT_CLINIC_OR_DEPARTMENT_OTHER): Payer: Self-pay

## 2024-09-24 MED ORDER — WEGOVY 2.4 MG/0.75ML ~~LOC~~ SOAJ
2.4000 mg | SUBCUTANEOUS | 0 refills | Status: AC
  Filled 2024-09-24: qty 2.25, 28d supply, fill #0
  Filled 2024-10-03: qty 3, 28d supply, fill #0
  Filled 2024-12-04: qty 3, 28d supply, fill #1

## 2024-10-03 ENCOUNTER — Other Ambulatory Visit (HOSPITAL_BASED_OUTPATIENT_CLINIC_OR_DEPARTMENT_OTHER): Payer: Self-pay

## 2024-10-03 ENCOUNTER — Other Ambulatory Visit: Payer: Self-pay

## 2024-10-04 ENCOUNTER — Other Ambulatory Visit (HOSPITAL_BASED_OUTPATIENT_CLINIC_OR_DEPARTMENT_OTHER): Payer: Self-pay

## 2024-10-09 ENCOUNTER — Other Ambulatory Visit (HOSPITAL_BASED_OUTPATIENT_CLINIC_OR_DEPARTMENT_OTHER): Payer: Self-pay

## 2024-12-04 ENCOUNTER — Other Ambulatory Visit (HOSPITAL_BASED_OUTPATIENT_CLINIC_OR_DEPARTMENT_OTHER): Payer: Self-pay
